# Patient Record
Sex: Male | Born: 1975 | Race: Black or African American | Hispanic: No | Marital: Married | Smoking: Current every day smoker
Health system: Southern US, Community
[De-identification: ages and names within clinical notes are randomized; demographics above are authoritative.]

## PROBLEM LIST (undated history)

## (undated) DIAGNOSIS — K219 Gastro-esophageal reflux disease without esophagitis: Secondary | ICD-10-CM

## (undated) DIAGNOSIS — I10 Essential (primary) hypertension: Secondary | ICD-10-CM

## (undated) HISTORY — PX: HAND TENDON SURGERY: SHX663

## (undated) HISTORY — DX: Essential (primary) hypertension: I10

---

## 2004-01-18 ENCOUNTER — Emergency Department (HOSPITAL_COMMUNITY): Admission: EM | Admit: 2004-01-18 | Discharge: 2004-01-18 | Payer: Self-pay | Admitting: Emergency Medicine

## 2004-01-19 ENCOUNTER — Ambulatory Visit (HOSPITAL_BASED_OUTPATIENT_CLINIC_OR_DEPARTMENT_OTHER): Admission: RE | Admit: 2004-01-19 | Discharge: 2004-01-19 | Payer: Self-pay | Admitting: Orthopedic Surgery

## 2010-02-22 ENCOUNTER — Emergency Department (HOSPITAL_COMMUNITY): Admission: EM | Admit: 2010-02-22 | Discharge: 2010-02-22 | Payer: Self-pay | Admitting: Family Medicine

## 2010-09-07 ENCOUNTER — Emergency Department (HOSPITAL_COMMUNITY): Admission: EM | Admit: 2010-09-07 | Discharge: 2010-02-09 | Payer: Self-pay | Admitting: Emergency Medicine

## 2010-12-17 ENCOUNTER — Emergency Department (HOSPITAL_COMMUNITY)
Admission: EM | Admit: 2010-12-17 | Discharge: 2010-12-17 | Disposition: A | Discharge status: Home / Self Care | Payer: Self-pay | Attending: Emergency Medicine | Admitting: Emergency Medicine

## 2011-02-16 NOTE — Op Note (Signed)
NAME:  [name], Max M                         ACCOUNT NO.:  [id number]   MEDICAL RECORD NO.:  [id number]                   PATIENT TYPE:  AMB   LOCATION:  DSC                                  FACILITY:  [facility]   PHYSICIAN:  Cindee Salt, M.D.                    DATE OF BIRTH:  [birth year]   DATE OF PROCEDURE:  01/19/2004  DATE OF DISCHARGE:                                 OPERATIVE REPORT   PREOPERATIVE DIAGNOSIS:  Laceration, extensor tendon, right middle finger.   POSTOPERATIVE DIAGNOSIS:  Laceration, extensor tendon, right middle finger.   OPERATION:  Repair of extensor tendon, right middle finger.   SURGEON:  Cindee Salt, M.D.   ASSISTANTCarolyne Fiscal.   ANESTHESIA:  Local.   HISTORY:  The patient is a 35 year old male who suffered a laceration over  the dorsal aspect of the metacarpophalangeal joint of his right middle  finger.  He was seen today in [facility] where this was closed.  He  was referred.   PROCEDURE:  The patient was brought to the operating room where a local  anesthetic was carried out without difficulty.  He was prepped using  Duraprep in the supine position.  Right arm free.  A tourniquet was placed  high and the arm was inflated 250 mmHg.  The sutures were removed.  The flap  was opened.  The laceration to the extensor tendon was found to be a large U-  shaped ulnarly based laceration.  The wound was extended proximally to be  able to see the proximal extent.  A repair was then performed with figure-of-  eight 4-0 [place] sutures along the entire extent of the laceration.  The  wound was copiously irrigated with saline prior to closure and the joint  proximally was involved.  No further lesions were identified.  The skin was  closed with interrupted 5-0 nylon suture.  Sterile compressive dressing and  splint were applied.   The patient tolerated the procedure well.  He was discharged home to return  to the [facility] in one week.   Vicodin and Keflex supplied  by the hospital.                                               Cindee Salt, M.D.    GK/MEDQ  D:  01/19/2004  T:  01/19/2004  Job:  [id number]

## 2012-02-21 ENCOUNTER — Encounter (HOSPITAL_COMMUNITY): Payer: Self-pay | Admitting: *Deleted

## 2012-02-21 ENCOUNTER — Emergency Department (HOSPITAL_COMMUNITY)
Admission: EM | Admit: 2012-02-21 | Discharge: 2012-02-21 | Disposition: A | Discharge status: Home / Self Care | Payer: Self-pay | Attending: Emergency Medicine | Admitting: Emergency Medicine

## 2012-02-21 ENCOUNTER — Emergency Department (HOSPITAL_COMMUNITY): Payer: Self-pay

## 2012-02-21 DIAGNOSIS — R109 Unspecified abdominal pain: Secondary | ICD-10-CM | POA: Insufficient documentation

## 2012-02-21 DIAGNOSIS — R599 Enlarged lymph nodes, unspecified: Secondary | ICD-10-CM | POA: Insufficient documentation

## 2012-02-21 DIAGNOSIS — R590 Localized enlarged lymph nodes: Secondary | ICD-10-CM

## 2012-02-21 DIAGNOSIS — R1030 Lower abdominal pain, unspecified: Secondary | ICD-10-CM

## 2012-02-21 LAB — DIFFERENTIAL
Basophils Absolute: 0 10*3/uL (ref 0.0–0.1)
Basophils Relative: 1 % (ref 0–1)
Monocytes Absolute: 0.2 10*3/uL (ref 0.1–1.0)
Neutro Abs: 2.6 10*3/uL (ref 1.7–7.7)

## 2012-02-21 LAB — COMPREHENSIVE METABOLIC PANEL
AST: 15 U/L (ref 0–37)
Albumin: 3.9 g/dL (ref 3.5–5.2)
Calcium: 9.6 mg/dL (ref 8.4–10.5)
Chloride: 106 mEq/L (ref 96–112)
Creatinine, Ser: 1.29 mg/dL (ref 0.50–1.35)
Total Bilirubin: 0.7 mg/dL (ref 0.3–1.2)
Total Protein: 7.3 g/dL (ref 6.0–8.3)

## 2012-02-21 LAB — CBC
HCT: 41.3 % (ref 39.0–52.0)
MCHC: 33.4 g/dL (ref 30.0–36.0)
RDW: 13.3 % (ref 11.5–15.5)

## 2012-02-21 LAB — URINE MICROSCOPIC-ADD ON

## 2012-02-21 LAB — URINALYSIS, ROUTINE W REFLEX MICROSCOPIC
Ketones, ur: NEGATIVE mg/dL
Leukocytes, UA: NEGATIVE
Nitrite: NEGATIVE
Protein, ur: NEGATIVE mg/dL

## 2012-02-21 MED ORDER — IBUPROFEN 800 MG PO TABS: 800.0000 mg | ORAL_TABLET | Freq: Three times a day (TID) | ORAL | Status: AC

## 2012-02-21 MED ORDER — IOHEXOL 300 MG/ML  SOLN
100.0000 mL | Freq: Once | INTRAMUSCULAR | Status: AC | PRN
  Administered 2012-02-21: 100 mL via INTRAVENOUS

## 2012-02-21 NOTE — ED Provider Notes (Signed)
History   This chart was scribed for Max Octave, MD by Max [name]. The patient was seen in room APA09/APA09. Patient's care was started at 1317.   CSN: [id number]  Arrival date & time 02/21/12  1317   First MD Initiated Contact with Patient 02/21/12 1508      Chief Complaint  Patient presents with  . Abdominal Pain    (Consider location/radiation/quality/duration/timing/severity/associated sxs/prior treatment) HPI  Max [name] is a 36 y.o. male who presents to the Emergency Department complaining of right lower abdominal pain onset four days ago and persistent since. Per PT says the pain is not worsening or getting better but has been constant. Denies injury, vomiting, dysuria, hematuria, fevers, pain in the testicles, abdominal surgery.      History reviewed. No pertinent past medical history.  History reviewed. No pertinent past surgical history.  History reviewed. No pertinent family history.  History  Substance Use Topics  . Smoking status: Current Everyday Smoker  . Smokeless tobacco: Not on file  . Alcohol Use: Yes      Review of Systems  All other systems reviewed and are negative.    Allergies  Review of patient's allergies indicates no known allergies.  Home Medications  No current outpatient prescriptions on file.  BP 143/84  Pulse 92  Temp(Src) 98.5 F (36.9 C) (Oral)  Resp 20  Ht 6' (1.829 m)  Wt 190 lb (86.183 kg)  BMI 25.77 kg/m2  SpO2 100%  Physical Exam  Nursing note and vitals reviewed. Constitutional: He is oriented to person, place, and time. He appears well-developed and well-nourished.  HENT:  Head: Normocephalic and atraumatic.  Eyes: Conjunctivae and EOM are normal. Pupils are equal, round, and reactive to light.  Neck: Normal range of motion. Neck supple.  Cardiovascular: Normal rate and regular rhythm.   Pulmonary/Chest: Effort normal and breath sounds normal.  Abdominal: Soft. Bowel sounds are normal. There is  no tenderness. There is no tenderness at McBurney's point.  Genitourinary:       Tenderness, right inguinal lymphadenopathy, No overlying skin change, no testicle pain, no appreciable inguinal hernia. No CVA pain.   Musculoskeletal: Normal range of motion.  Neurological: He is alert and oriented to person, place, and time.  Skin: Skin is warm and dry.  Psychiatric: He has a normal mood and affect.    ED Course  Procedures (including critical care time)  0322 Pt seen and agrees with course of care, to have UA and blood work.    Labs Reviewed  URINALYSIS, ROUTINE W REFLEX MICROSCOPIC - Abnormal; Notable for the following:    Hgb urine dipstick TRACE (*)    All other components within normal limits  COMPREHENSIVE METABOLIC PANEL - Abnormal; Notable for the following:    GFR calc non Af Amer 71 (*)    GFR calc Af Amer 82 (*)    All other components within normal limits  CBC  DIFFERENTIAL  URINE MICROSCOPIC-ADD ON  GC/CHLAMYDIA PROBE AMP, URINE   Ct Abdomen Pelvis W Contrast  02/21/2012  *RADIOLOGY REPORT*  Clinical Data: Abdominal pain.  CT ABDOMEN AND PELVIS WITH CONTRAST  Technique:  Multidetector CT imaging of the abdomen and pelvis was performed following the standard protocol during bolus administration of intravenous contrast.  Contrast: OMNIPAQUE IOHEXOL 300 MG/ML  SOLN  Comparison: None.  Findings: Lung bases are clear.  No effusions.  Heart is normal size.  Appendix is visualized and is normal. Bowel grossly unremarkable. No free fluid,  free air, or adenopathy.  The liver, gallbladder, spleen, pancreas, adrenals and kidneys are unremarkable.  No acute bony abnormality.  IMPRESSION: Unremarkable study.  Original Report Authenticated By: Cyndie Chime, M.D.     No diagnosis found.    MDM  Right inguinal pain for the past 4 days without history of trauma, nausea, vomiting, fever, urinary symptoms or testicular pain. Tender lymphadenopathy on exam without appreciable  inguinal hernia. no testicular pain. Abdomen soft and nontender. No pain at McBurney's point.  Urinalysis negative. No white blood cell count. No testicular pain. GC, chlamydia swab sent. We'll treat with anti-inflammatories and followup with PCP. No antibiotics indicated at this point.  I personally performed the services described in this documentation, which was scribed in my presence.  The recorded information has been reviewed and considered.    Max Octave, MD 02/21/12 [phone]

## 2012-02-21 NOTE — ED Notes (Signed)
Pain RLQ x 4 days, no nvd.

## 2012-02-21 NOTE — Discharge Instructions (Signed)
Lymphadenopathy Lymphadenopathy means "disease of the lymph glands." But the term is usually used to describe swollen or enlarged lymph glands, also called lymph nodes. These are the bean-shaped organs found in many locations including the neck, underarm, and groin. Lymph glands are part of the immune system, which fights infections in your body. Lymphadenopathy can occur in just one area of the body, such as the neck, or it can be generalized, with lymph node enlargement in several areas. The nodes found in the neck are the most common sites of lymphadenopathy. CAUSES  When your immune system responds to germs (such as viruses or bacteria ), infection-fighting cells and fluid build up. This causes the glands to grow in size. This is usually not something to worry about. Sometimes, the glands themselves can become infected and inflamed. This is called lymphadenitis. Enlarged lymph nodes can be caused by many diseases:  Bacterial disease, such as strep throat or a skin infection.   Viral disease, such as a common cold.   Other germs, such as lyme disease, tuberculosis, or sexually transmitted diseases.   Cancers, such as lymphoma (cancer of the lymphatic system) or leukemia (cancer of the white blood cells).   Inflammatory diseases such as lupus or rheumatoid arthritis.   Reactions to medications.  Many of the diseases above are rare, but important. This is why you should see your caregiver if you have lymphadenopathy. SYMPTOMS   Swollen, enlarged lumps in the neck, back of the head or other locations.   Tenderness.   Warmth or redness of the skin over the lymph nodes.   Fever.  DIAGNOSIS  Enlarged lymph nodes are often near the source of infection. They can help healthcare providers diagnose your illness. For instance:   Swollen lymph nodes around the jaw might be caused by an infection in the mouth.   Enlarged glands in the neck often signal a throat infection.   Lymph nodes that  are swollen in more than one area often indicate an illness caused by a virus.  Your caregiver most likely will know what is causing your lymphadenopathy after listening to your history and examining you. Blood tests, x-rays or other tests may be needed. If the cause of the enlarged lymph node cannot be found, and it does not go away by itself, then a biopsy may be needed. Your caregiver will discuss this with you. TREATMENT  Treatment for your enlarged lymph nodes will depend on the cause. Many times the nodes will shrink to normal size by themselves, with no treatment. Antibiotics or other medicines may be needed for infection. Only take over-the-counter or prescription medicines for pain, discomfort or fever as directed by your caregiver. HOME CARE INSTRUCTIONS  Swollen lymph glands usually return to normal when the underlying medical condition goes away. If they persist, contact your health-care provider. He/she might prescribe antibiotics or other treatments, depending on the diagnosis. Take any medications exactly as prescribed. Keep any follow-up appointments made to check on the condition of your enlarged nodes.  SEEK MEDICAL CARE IF:   Swelling lasts for more than two weeks.   You have symptoms such as weight loss, night sweats, fatigue or fever that does not go away.   The lymph nodes are hard, seem fixed to the skin or are growing rapidly.   Skin over the lymph nodes is red and inflamed. This could mean there is an infection.  SEEK IMMEDIATE MEDICAL CARE IF:   Fluid starts leaking from the area of the   enlarged lymph node.   You develop a fever of 102 F (38.9 C) or greater.   Severe pain develops (not necessarily at the site of a large lymph node).   You develop chest pain or shortness of breath.   You develop worsening abdominal pain.  MAKE SURE YOU:   Understand these instructions.   Will watch your condition.   Will get help right away if you are not doing well or get  worse.  Document Released: 06/26/2008 Document Revised: 09/06/2011 Document Reviewed: 06/26/2008 Exit[employer] Patient Information 2012 [employer]. 

## 2012-02-22 LAB — GC/CHLAMYDIA PROBE AMP, URINE: Chlamydia, Swab/Urine, PCR: NEGATIVE

## 2013-01-15 ENCOUNTER — Emergency Department (HOSPITAL_COMMUNITY)
Admission: EM | Admit: 2013-01-15 | Discharge: 2013-01-15 | Disposition: A | Discharge status: Home / Self Care | Payer: BC Managed Care – PPO | Attending: Emergency Medicine | Admitting: Emergency Medicine

## 2013-01-15 ENCOUNTER — Encounter (HOSPITAL_COMMUNITY): Payer: Self-pay

## 2013-01-15 DIAGNOSIS — F17200 Nicotine dependence, unspecified, uncomplicated: Secondary | ICD-10-CM | POA: Insufficient documentation

## 2013-01-15 DIAGNOSIS — R61 Generalized hyperhidrosis: Secondary | ICD-10-CM | POA: Insufficient documentation

## 2013-01-15 DIAGNOSIS — K529 Noninfective gastroenteritis and colitis, unspecified: Secondary | ICD-10-CM

## 2013-01-15 DIAGNOSIS — K5289 Other specified noninfective gastroenteritis and colitis: Secondary | ICD-10-CM | POA: Insufficient documentation

## 2013-01-15 DIAGNOSIS — R197 Diarrhea, unspecified: Secondary | ICD-10-CM | POA: Insufficient documentation

## 2013-01-15 LAB — URINALYSIS, ROUTINE W REFLEX MICROSCOPIC
Hgb urine dipstick: NEGATIVE
Nitrite: NEGATIVE
Specific Gravity, Urine: 1.025 (ref 1.005–1.030)
pH: 6 (ref 5.0–8.0)

## 2013-01-15 LAB — CBC WITH DIFFERENTIAL/PLATELET
Basophils Absolute: 0 10*3/uL (ref 0.0–0.1)
HCT: 43.8 % (ref 39.0–52.0)
Hemoglobin: 15 g/dL (ref 13.0–17.0)
Lymphocytes Relative: 31 % (ref 12–46)
Monocytes Absolute: 0.2 10*3/uL (ref 0.1–1.0)
Monocytes Relative: 4 % (ref 3–12)
Neutro Abs: 2.5 10*3/uL (ref 1.7–7.7)
Neutrophils Relative %: 61 % (ref 43–77)
WBC: 4.2 10*3/uL (ref 4.0–10.5)

## 2013-01-15 LAB — URINE MICROSCOPIC-ADD ON

## 2013-01-15 LAB — COMPREHENSIVE METABOLIC PANEL
Albumin: 4.4 g/dL (ref 3.5–5.2)
BUN: 16 mg/dL (ref 6–23)
Calcium: 9.6 mg/dL (ref 8.4–10.5)
Creatinine, Ser: 1.41 mg/dL — ABNORMAL HIGH (ref 0.50–1.35)
Total Protein: 7.8 g/dL (ref 6.0–8.3)

## 2013-01-15 LAB — LIPASE, BLOOD: Lipase: 16 U/L (ref 11–59)

## 2013-01-15 MED ORDER — ONDANSETRON HCL 4 MG/2ML IJ SOLN
4.0000 mg | Freq: Once | INTRAMUSCULAR | Status: AC
  Administered 2013-01-15: 4 mg via INTRAVENOUS
  Filled 2013-01-15: qty 2

## 2013-01-15 MED ORDER — DIPHENOXYLATE-ATROPINE 2.5-0.025 MG PO TABS
2.0000 | ORAL_TABLET | Freq: Once | ORAL | Status: AC
  Administered 2013-01-15: 2 via ORAL
  Filled 2013-01-15: qty 2

## 2013-01-15 MED ORDER — DIPHENOXYLATE-ATROPINE 2.5-0.025 MG PO TABS: 1.0000 | ORAL_TABLET | Freq: Four times a day (QID) | ORAL | Status: DC | PRN

## 2013-01-15 MED ORDER — ONDANSETRON HCL 4 MG PO TABS: 4.0000 mg | ORAL_TABLET | Freq: Three times a day (TID) | ORAL | Status: DC | PRN

## 2013-01-15 MED ORDER — SODIUM CHLORIDE 0.9 % IV BOLUS (SEPSIS)
1000.0000 mL | Freq: Once | INTRAVENOUS | Status: AC
  Administered 2013-01-15: 1000 mL via INTRAVENOUS

## 2013-01-15 NOTE — ED Notes (Addendum)
Complain of vomiting yesterday. Diarrhea and nausea today.

## 2013-01-15 NOTE — ED Notes (Signed)
Patient is resting comfortably. 

## 2013-01-16 NOTE — ED Provider Notes (Signed)
History     CSN: [id number]  Arrival date & time 01/15/13  1324   First MD Initiated Contact with Patient 01/15/13 0809      Chief Complaint  Patient presents with  . Emesis    (Consider location/radiation/quality/duration/timing/severity/associated sxs/prior treatment) HPI Comments: Max [name] is a 37 y.o. Male otherwise healthy patient who developed several episodes of vomiting late last night,  Then woke with nonbloody, loosely formed stools this morning x 2.  He continues to have nausea without emesis and last episode of diarrhea was over 2 hours ago.  He describes abdominal cramping prior to bm's,  He has not tolerated PO intake this morning.  But is otherwise abdominal pain free and has had no documented fevers,  But mother reports was diaphoretic after emesis last night.  He reports ate chicken and vegetables 2 nights ago, then ate leftover chicken for breakfast yesterday morning that had been left out on the stove.  It tasted good, no other household members ate the leftover chicken nor do they have symptoms.       The history is provided by the patient and a parent.    History reviewed. No pertinent past medical history.  History reviewed. No pertinent past surgical history.  No family history on file.  History  Substance Use Topics  . Smoking status: Current Every Day Smoker  . Smokeless tobacco: Not on file  . Alcohol Use: Yes      Review of Systems  Constitutional: Positive for diaphoresis. Negative for fever.  HENT: Negative for congestion, sore throat and neck pain.   Eyes: Negative.   Respiratory: Negative for chest tightness and shortness of breath.   Cardiovascular: Negative for chest pain.  Gastrointestinal: Positive for nausea, vomiting and diarrhea. Negative for abdominal pain and abdominal distention.  Genitourinary: Negative.   Musculoskeletal: Negative for joint swelling and arthralgias.  Skin: Negative.  Negative for rash and wound.   Neurological: Negative for dizziness, weakness, light-headedness, numbness and headaches.  Psychiatric/Behavioral: Negative.     Allergies  Review of patient's allergies indicates no known allergies.  Home Medications   Current Outpatient Rx  Name  Route  Sig  Dispense  Refill  . diphenoxylate-atropine (LOMOTIL) 2.5-0.025 MG per tablet   Oral   Take 1 tablet by mouth 4 (four) times daily as needed for diarrhea or loose stools.   15 tablet   0   . ondansetron (ZOFRAN) 4 MG tablet   Oral   Take 1 tablet (4 mg total) by mouth every 8 (eight) hours as needed for nausea.   12 tablet   0     BP 126/79  Pulse 59  Temp(Src) 98.1 F (36.7 C) (Oral)  Resp 16  Ht 6' (1.829 m)  Wt 185 lb (83.915 kg)  BMI 25.08 kg/m2  SpO2 100%  Physical Exam  Nursing note and vitals reviewed. Constitutional: He appears well-developed and well-nourished.  HENT:  Head: Normocephalic and atraumatic.  Mouth/Throat: Oropharynx is clear and moist.  Eyes: Conjunctivae are normal.  Neck: Normal range of motion.  Cardiovascular: Normal rate, regular rhythm, normal heart sounds and intact distal pulses.   Pulmonary/Chest: Effort normal and breath sounds normal. He has no wheezes.  Abdominal: Soft. Bowel sounds are normal. He exhibits no distension. There is no tenderness. There is no rebound and no guarding.  Musculoskeletal: Normal range of motion.  Neurological: He is alert.  Skin: Skin is warm and dry.  Psychiatric: He has a normal mood and  affect.    ED Course  Procedures (including critical care time)  Labs Reviewed  COMPREHENSIVE METABOLIC PANEL - Abnormal; Notable for the following:    Creatinine, Ser 1.41 (*)    GFR calc non Af Amer 63 (*)    GFR calc Af Amer 73 (*)    All other components within normal limits  URINALYSIS, ROUTINE W REFLEX MICROSCOPIC - Abnormal; Notable for the following:    Bilirubin Urine SMALL (*)    Ketones, ur TRACE (*)    Protein, ur TRACE (*)     Urobilinogen, UA 2.0 (*)    All other components within normal limits  URINE MICROSCOPIC-ADD ON - Abnormal; Notable for the following:    Squamous Epithelial / LPF FEW (*)    All other components within normal limits  LIPASE, BLOOD  CBC WITH DIFFERENTIAL   No results found.   1. Gastroenteritis    Medications  sodium chloride 0.9 % bolus 1,000 mL (0 mLs Intravenous Stopped 01/15/13 0959)  ondansetron (ZOFRAN) injection 4 mg (4 mg Intravenous Given 01/15/13 0851)  diphenoxylate-atropine (LOMOTIL) 2.5-0.025 MG per tablet 2 tablet (2 tablets Oral Given 01/15/13 1047)     Pt was given IV fluids, zofran and he has tolerated PO intake while in ed.  He had no diarrhea and no emesis while in ed MDM  Pt with probable viral gastroenteritis.  Labs reviewed, creatinine borderline elevated,  gfr also low as it was last year at previous visit.  Discussed this with patient and encouraged obtaining pcp with further testing as warranted.  Prescribed zofran prn continued nausea.  Given one dose of lomotil,  Prescription as needed for continued diarrhea.  BRAT.  Prn f/u.        Burgess Amor, PA-C 01/16/13 [phone]

## 2013-01-19 NOTE — ED Provider Notes (Signed)
Medical screening examination/treatment/procedure(s) were performed by non-physician practitioner and as supervising physician I was immediately available for consultation/collaboration.    Shelda Jakes, MD 01/19/13 1018

## 2013-01-20 ENCOUNTER — Telehealth (HOSPITAL_COMMUNITY): Payer: Self-pay | Admitting: Emergency Medicine

## 2013-01-20 NOTE — ED Notes (Signed)
Pt called to check on gc/clamydia probe and pt told that probe was negative.

## 2016-09-22 ENCOUNTER — Encounter (HOSPITAL_COMMUNITY): Payer: Self-pay | Admitting: Emergency Medicine

## 2016-09-22 ENCOUNTER — Emergency Department (HOSPITAL_COMMUNITY): Payer: Self-pay

## 2016-09-22 ENCOUNTER — Emergency Department (HOSPITAL_COMMUNITY)
Admission: EM | Admit: 2016-09-22 | Discharge: 2016-09-22 | Disposition: A | Discharge status: Home / Self Care | Payer: Self-pay | Attending: Emergency Medicine | Admitting: Emergency Medicine

## 2016-09-22 DIAGNOSIS — F17290 Nicotine dependence, other tobacco product, uncomplicated: Secondary | ICD-10-CM | POA: Insufficient documentation

## 2016-09-22 DIAGNOSIS — M546 Pain in thoracic spine: Secondary | ICD-10-CM

## 2016-09-22 DIAGNOSIS — R030 Elevated blood-pressure reading, without diagnosis of hypertension: Secondary | ICD-10-CM | POA: Insufficient documentation

## 2016-09-22 DIAGNOSIS — F1290 Cannabis use, unspecified, uncomplicated: Secondary | ICD-10-CM | POA: Insufficient documentation

## 2016-09-22 DIAGNOSIS — M62830 Muscle spasm of back: Secondary | ICD-10-CM | POA: Insufficient documentation

## 2016-09-22 DIAGNOSIS — D171 Benign lipomatous neoplasm of skin and subcutaneous tissue of trunk: Secondary | ICD-10-CM | POA: Insufficient documentation

## 2016-09-22 MED ORDER — CYCLOBENZAPRINE HCL 10 MG PO TABS: 10.0000 mg | ORAL_TABLET | Freq: Three times a day (TID) | ORAL | 0 refills | Status: DC

## 2016-09-22 MED ORDER — TRAMADOL HCL 50 MG PO TABS: 50.0000 mg | ORAL_TABLET | Freq: Four times a day (QID) | ORAL | 0 refills | Status: DC | PRN

## 2016-09-22 NOTE — ED Triage Notes (Signed)
Patient c/o mid-back pain that radiates up into shoulders and neck. Per patient pain in neck with certain movement. Denies any current injuries but states old injuries possibly aggravated by lifting and twisting and work. Patient reports pain x3-4 days in which he has taken Aleve with no relief.

## 2016-09-22 NOTE — Discharge Instructions (Signed)
As discussed your x-rays are negative with no acute findings of your thoracic spine.  Suspect you may have muscle strain and nerve irritation causing your symptoms.  Use the medications as prescribed, using caution with these as they can make you sleepy.  Apply heating pad to your back 20 minutes several times daily.  You should have your blood pressure rechecked when your symptoms are improved as your blood pressure is elevated today but could be secondary to pain.  Additionally as discussed if this lipoma is causing you discomfort you may consider surgical removal.  You are been referred to Dr. Rosana Hoes if you decide you want to proceed with this procedure.
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
## 2016-10-01 HISTORY — PX: COLONOSCOPY: SHX174

## 2016-10-16 ENCOUNTER — Ambulatory Visit (INDEPENDENT_AMBULATORY_CARE_PROVIDER_SITE_OTHER): Payer: Self-pay | Admitting: Adult Health

## 2016-10-16 ENCOUNTER — Encounter: Payer: Self-pay | Admitting: Gastroenterology

## 2016-10-16 ENCOUNTER — Encounter: Payer: Self-pay | Admitting: Adult Health

## 2016-10-16 VITALS — BP 142/82 | Temp 98.0°F | Ht 72.0 in | Wt 202.2 lb

## 2016-10-16 DIAGNOSIS — Z1211 Encounter for screening for malignant neoplasm of colon: Secondary | ICD-10-CM

## 2016-10-16 DIAGNOSIS — I10 Essential (primary) hypertension: Secondary | ICD-10-CM

## 2016-10-16 DIAGNOSIS — Z7689 Persons encountering health services in other specified circumstances: Secondary | ICD-10-CM

## 2016-10-16 NOTE — Progress Notes (Signed)
Patient presents to clinic today to establish care. 41 year old male who  has a past medical history of Essential hypertension.  Acute Concerns: Establish Care   Chronic Issues: Essential Hypertension  - He was seen in the ER on 09/22/2016 and had elevation in diastolic blood pressure readings. He has not been checking his blood pressure at home.   BP Readings from Last 3 Encounters:  10/16/16 (!) 142/82  09/22/16 (!) 128/101  01/15/13 126/79   He denies any dizziness or lightheadedness.   Family history of colon cancer - Reports that his paternal grandfather and multiple aunts and uncles on his father side have been diagnosed with colon cancer    Health Maintenance: Dental -- Does not do routine care  Vision -- Does not do routine care  Immunizations -- UTD - does not want flu  Colonoscopy -- Never had  Diet: He has fast food atleast once per day  Exercise: walks at home but not on a regular basis    Past Medical History:  Diagnosis Date  . Essential hypertension     Past Surgical History:  Procedure Laterality Date  . HAND TENDON SURGERY Right     Current Outpatient Prescriptions on File Prior to Visit  Medication Sig Dispense Refill  . cyclobenzaprine (FLEXERIL) 10 MG tablet Take 1 tablet (10 mg total) by mouth 3 (three) times daily. 30 tablet 0  . traMADol (ULTRAM) 50 MG tablet Take 1 tablet (50 mg total) by mouth every 6 (six) hours as needed. 20 tablet 0   No current facility-administered medications on file prior to visit.     No Known Allergies  Family History  Problem Relation Age of Onset  . Cancer Mother   . Cancer Father   . Diabetes Sister     Social History   Social History  . Marital status: Single    Spouse name: N/A  . Number of children: N/A  . Years of education: N/A   Occupational History  . Not on file.   Social History Main Topics  . Smoking status: Current Every Day Smoker    Packs/day: 0.03    Years: 22.00    Types:  Cigars  . Smokeless tobacco: Never Used  . Alcohol use Yes     Comment: occasional  . Drug use:     Types: Marijuana  . Sexual activity: Not on file   Other Topics Concern  . Not on file   Social History Narrative   Works as a Educational psychologist    Not married    Two children - do not live with this     Review of Systems  Constitutional: Negative.   HENT: Negative.   Eyes: Negative.   Respiratory: Negative.   Cardiovascular: Negative.   Genitourinary: Negative.   Skin: Negative.   Neurological: Negative.   Psychiatric/Behavioral: Negative.   All other systems reviewed and are negative.   BP (!) 142/82   Temp 98 F (36.7 C) (Oral)   Ht 6' (1.829 m)   Wt 202 lb 3.2 oz (91.7 kg)   BMI 27.42 kg/m   Physical Exam  Constitutional: He is oriented to person, place, and time and well-developed, well-nourished, and in no distress. No distress.  Neck: Normal range of motion. Neck supple.  Cardiovascular: Normal rate, regular rhythm, normal heart sounds and intact distal pulses.  Exam reveals no gallop and no friction rub.   No murmur heard. Pulmonary/Chest: Effort normal and breath sounds normal.  No respiratory distress. He has no wheezes. He has no rales. He exhibits no tenderness.  Abdominal: Soft. Bowel sounds are normal. He exhibits no distension and no mass. There is no tenderness. There is no rebound and no guarding.  Lymphadenopathy:    He has no cervical adenopathy.  Neurological: He is alert and oriented to person, place, and time. Gait normal. GCS score is 15.  Skin: Skin is warm and dry. No rash noted. He is not diaphoretic. No erythema. No pallor.  Psychiatric: Mood, memory, affect and judgment normal.  Nursing note and vitals reviewed.   Assessment/Plan: 1. Encounter to establish care - Follow up for CPE  - Follow up sooner if needed - I would like him to cut out fast food and start exercising   2. Colon cancer screening - Ambulatory referral to  Gastroenterology  3. Essential hypertension - I will give him 1-2 months to change diet and exercising.  - If no improvement in blood pressure then will start him on lisinopril  - Start monitoring at home  Dorothyann Peng, NP

## 2016-11-23 ENCOUNTER — Ambulatory Visit (INDEPENDENT_AMBULATORY_CARE_PROVIDER_SITE_OTHER): Payer: Self-pay | Admitting: Gastroenterology

## 2016-11-23 ENCOUNTER — Encounter (INDEPENDENT_AMBULATORY_CARE_PROVIDER_SITE_OTHER): Payer: Self-pay

## 2016-11-23 ENCOUNTER — Encounter: Payer: Self-pay | Admitting: Gastroenterology

## 2016-11-23 VITALS — BP 118/90 | HR 72 | Ht 72.0 in | Wt 195.0 lb

## 2016-11-23 DIAGNOSIS — Z800 Family history of malignant neoplasm of digestive organs: Secondary | ICD-10-CM

## 2016-11-23 DIAGNOSIS — K625 Hemorrhage of anus and rectum: Secondary | ICD-10-CM

## 2016-11-23 DIAGNOSIS — I10 Essential (primary) hypertension: Secondary | ICD-10-CM | POA: Insufficient documentation

## 2016-11-23 MED ORDER — NA SULFATE-K SULFATE-MG SULF 17.5-3.13-1.6 GM/177ML PO SOLN: 1.0000 | Freq: Once | ORAL | 0 refills | Status: AC

## 2016-11-23 NOTE — Progress Notes (Signed)
HPI :  41 y/o male with a history of untreated hypertension here for a new patient visit to discuss colon cancer screening.   Father side of the family with strong history of colon cancer - grandfather - thinks passed at around 68, 1 aunt - dx age 30, 40 uncles  - all with colon cancer, ranging age at diagnosis from 41 to 89s,  2 uncles with pancreatic cancer. Father had leukemia.   He has had some blood in the stools in the past periodically. Streaks of red blood on the tissue paper. No constipation or diarrhea. No weight loss. Eating well. No abdominal pains.   He has had history of reflux and heartburn. He is taking zantac PRN which works well. He has not tolerated prilosec in the past.   He has a history of HTN but states he does not take anything for it currently. Due to follow up with primary care in the near future to consider therapy per his report.  Past Medical History:  Diagnosis Date  . Essential hypertension      Past Surgical History:  Procedure Laterality Date  . HAND TENDON SURGERY Right    Family History  Problem Relation Age of Onset  . Breast cancer Mother   . Leukemia Father   . Diabetes Sister   . Colon cancer Paternal Grandfather   . Diabetes Brother     from gun  shot  . Heart disease Maternal Grandmother   . Heart attack Maternal Grandmother   . Colon cancer Paternal Aunt   . Colon cancer Paternal Uncle     x 4  . Prostate cancer Paternal Uncle     x 2  . Lupus Maternal Uncle     x 2  . Kidney failure Maternal Uncle    Social History  Substance Use Topics  . Smoking status: Current Every Day Smoker    Packs/day: 0.03    Years: 22.00    Types: Cigars  . Smokeless tobacco: Never Used  . Alcohol use Yes     Comment: occasional   No current outpatient prescriptions on file.   No current facility-administered medications for this visit.    No Known Allergies   Review of Systems: All systems reviewed and negative except where noted in  HPI.   Lab Results  Component Value Date   WBC 4.2 01/15/2013   HGB 15.0 01/15/2013   HCT 43.8 01/15/2013   MCV 85.4 01/15/2013   PLT 178 01/15/2013     Physical Exam: BP 118/90 (BP Location: Left Arm, Patient Position: Sitting, Cuff Size: Normal)   Pulse 72   Ht 6' (1.829 m) Comment: height measured without shoes  Wt 195 lb (88.5 kg)   BMI 26.45 kg/m  Constitutional: Pleasant,well-developed, male in no acute distress. HEENT: Normocephalic and atraumatic. Conjunctivae are normal. No scleral icterus. Neck supple.  Cardiovascular: Normal rate, regular rhythm.  Pulmonary/chest: Effort normal and breath sounds normal. No wheezing, rales or rhonchi. Abdominal: Soft, nondistended, nontender. There are no masses palpable. No hepatomegaly. Extremities: no edema Lymphadenopathy: No cervical adenopathy noted. Neurological: Alert and oriented to person place and time. Skin: Skin is warm and dry. No rashes noted. Psychiatric: Normal mood and affect. Behavior is normal.   ASSESSMENT AND PLAN: 41 year old male with history of periodic scant rectal bleeding here and strong family history of colon cancer on father side of family as outlined above, he has 6 second degree relatives with colon cancer, some diagnosed at age  46s. Given this family history and periodic bleeding I offered him a colonoscopy. We discussed risks and benefits of colonoscopy and anesthesia and following this discussion he wished to proceed. Further recommendations pending the results. If he has any high-risk lesions we may refer him for genetic counseling.   Cushing Cellar, MD [facility] Pager [phone]  CC: Dorothyann Peng, NP

## 2016-11-23 NOTE — Patient Instructions (Signed)
If you are age 41 or older, your body mass index should be between 23-30. Your Body mass index is 26.45 kg/m. If this is out of the aforementioned range listed, please consider follow up with your Primary Care Provider.  If you are age 50 or younger, your body mass index should be between 19-25. Your Body mass index is 26.45 kg/m. If this is out of the aformentioned range listed, please consider follow up with your Primary Care Provider.   We have sent the following medications to your pharmacy for you to pick up at your convenience:  Cool have been scheduled for a colonoscopy. Please follow written instructions given to you at your visit today.  Please pick up your prep supplies at the pharmacy within the next 1-3 days. If you use inhalers (even only as needed), please bring them with you on the day of your procedure. Your physician has requested that you go to www.startemmi.com and enter the access code given to you at your visit today. This web site gives a general overview about your procedure. However, you should still follow specific instructions given to you by our office regarding your preparation for the procedure.  Thank you.

## 2016-12-14 ENCOUNTER — Encounter: Payer: Self-pay | Admitting: Adult Health

## 2016-12-14 NOTE — Progress Notes (Deleted)
Subjective:    Patient ID: Max Moore Emert Jr., male    DOB: 1976/07/20, 41 y.o.   MRN: [id number]  HPI  Patient presents for yearly preventative medicine examination. He is a pleasant 41 year old male who  has a past medical history of Essential hypertension.  All immunizations and health maintenance protocols were reviewed with the patient and needed orders were placed.  Appropriate screening laboratory values were ordered for the patient including screening of hyperlipidemia, renal function and hepatic function. If indicated by BPH, a PSA was ordered.  Medication reconciliation,  past medical history, social history, problem list and allergies were reviewed in detail with the patient  Goals were established with regard to weight loss, exercise, and  diet in compliance with medications  Review of Systems  Constitutional: Negative.   HENT: Negative.   Eyes: Negative.   Respiratory: Negative.   Cardiovascular: Negative.   Gastrointestinal: Negative.   Endocrine: Negative.   Genitourinary: Negative.   Musculoskeletal: Negative.   Skin: Negative.   Allergic/Immunologic: Negative.   Neurological: Negative.   Hematological: Negative.   Psychiatric/Behavioral: Negative.   All other systems reviewed and are negative.  Past Medical History:  Diagnosis Date  . Essential hypertension     Social History   Social History  . Marital status: Single    Spouse name: N/A  . Number of children: 2  . Years of education: N/A   Occupational History  . Not on file.   Social History Main Topics  . Smoking status: Current Every Day Smoker    Packs/day: 0.03    Years: 22.00    Types: Cigars  . Smokeless tobacco: Never Used  . Alcohol use Yes     Comment: occasional  . Drug use: Yes    Types: Marijuana  . Sexual activity: Not on file   Other Topics Concern  . Not on file   Social History Narrative   Works as a Educational psychologist    Not married    Two children - do not  live with this     Past Surgical History:  Procedure Laterality Date  . HAND TENDON SURGERY Right     Family History  Problem Relation Age of Onset  . Breast cancer Mother   . Leukemia Father   . Diabetes Sister   . Colon cancer Paternal Grandfather   . Diabetes Brother     from gun  shot  . Heart disease Maternal Grandmother   . Heart attack Maternal Grandmother   . Colon cancer Paternal Aunt   . Colon cancer Paternal Uncle     x 4  . Prostate cancer Paternal Uncle     x 2  . Lupus Maternal Uncle     x 2  . Kidney failure Maternal Uncle     No Known Allergies  No current outpatient prescriptions on file prior to visit.   No current facility-administered medications on file prior to visit.     There were no vitals taken for this visit.      Objective:   Physical Exam  Constitutional: He is oriented to person, place, and time. He appears well-developed and well-nourished. No distress.  HENT:  Head: Normocephalic and atraumatic.  Right Ear: External ear normal.  Left Ear: External ear normal.  Nose: Nose normal.  Mouth/Throat: Oropharynx is clear and moist. No oropharyngeal exudate.  Eyes: Conjunctivae and EOM are normal. Pupils are equal, round, and reactive to light. Right eye exhibits no  discharge. Left eye exhibits no discharge. No scleral icterus.  Neck: Normal range of motion. Neck supple. No JVD present. No tracheal deviation present. No thyromegaly present.  Cardiovascular: Normal rate, regular rhythm, normal heart sounds and intact distal pulses.  Exam reveals no gallop and no friction rub.   No murmur heard. Pulmonary/Chest: Effort normal and breath sounds normal. No stridor. No respiratory distress. He has no wheezes. He has no rales. He exhibits no tenderness.  Abdominal: Soft. Bowel sounds are normal. He exhibits no distension and no mass. There is no tenderness. There is no rebound and no guarding.  Musculoskeletal: Normal range of motion. He exhibits  no edema, tenderness or deformity.  Lymphadenopathy:    He has no cervical adenopathy.  Neurological: He is alert and oriented to person, place, and time. He has normal reflexes. He displays normal reflexes. No cranial nerve deficit. He exhibits normal muscle tone. Coordination normal.  Skin: Skin is warm and dry. No rash noted. He is not diaphoretic. No erythema. No pallor.  Psychiatric: He has a normal mood and affect. His behavior is normal. Judgment and thought content normal.  Nursing note and vitals reviewed.     Assessment & Plan:

## 2017-01-03 ENCOUNTER — Encounter: Payer: Self-pay | Admitting: Internal Medicine

## 2017-01-03 ENCOUNTER — Encounter: Payer: Self-pay | Admitting: Gastroenterology

## 2017-01-03 ENCOUNTER — Ambulatory Visit (AMBULATORY_SURGERY_CENTER): Payer: Self-pay | Admitting: Gastroenterology

## 2017-01-03 VITALS — BP 146/78 | HR 69 | Temp 98.9°F | Resp 13 | Ht 72.0 in | Wt 195.0 lb

## 2017-01-03 DIAGNOSIS — D128 Benign neoplasm of rectum: Secondary | ICD-10-CM

## 2017-01-03 DIAGNOSIS — Z800 Family history of malignant neoplasm of digestive organs: Secondary | ICD-10-CM

## 2017-01-03 DIAGNOSIS — K621 Rectal polyp: Secondary | ICD-10-CM

## 2017-01-03 DIAGNOSIS — K635 Polyp of colon: Secondary | ICD-10-CM

## 2017-01-03 DIAGNOSIS — D122 Benign neoplasm of ascending colon: Secondary | ICD-10-CM

## 2017-01-03 DIAGNOSIS — K921 Melena: Secondary | ICD-10-CM

## 2017-01-03 DIAGNOSIS — D129 Benign neoplasm of anus and anal canal: Secondary | ICD-10-CM

## 2017-01-03 MED ORDER — SODIUM CHLORIDE 0.9 % IV SOLN: 500.0000 mL | INTRAVENOUS | Status: DC

## 2017-01-03 NOTE — Patient Instructions (Signed)
YOU HAD AN ENDOSCOPIC PROCEDURE TODAY AT [facility]:   Refer to the procedure report that was given to you for any specific questions about what was found during the examination.  If the procedure report does not answer your questions, please call your gastroenterologist to clarify.  If you requested that your care partner not be given the details of your procedure findings, then the procedure report has been included in a sealed envelope for you to review at your convenience later.  YOU SHOULD EXPECT: Some feelings of bloating in the abdomen. Passage of more gas than usual.  Walking can help get rid of the air that was put into your GI tract during the procedure and reduce the bloating. If you had a lower endoscopy (such as a colonoscopy or flexible sigmoidoscopy) you may notice spotting of blood in your stool or on the toilet paper. If you underwent a bowel prep for your procedure, you may not have a normal bowel movement for a few days.  Please Note:  You might notice some irritation and congestion in your nose or some drainage.  This is from the oxygen used during your procedure.  There is no need for concern and it should clear up in a day or so.  SYMPTOMS TO REPORT IMMEDIATELY:   Following lower endoscopy (colonoscopy or flexible sigmoidoscopy):  Excessive amounts of blood in the stool  Significant tenderness or worsening of abdominal pains  Swelling of the abdomen that is new, acute  Fever of 100F or higher   For urgent or emergent issues, a gastroenterologist can be reached at any hour by calling [phone].   DIET:  We do recommend a small meal at first, but then you may proceed to your regular diet.  Drink plenty of fluids but you should avoid alcoholic beverages for 24 hours.  ACTIVITY:  You should plan to take it easy for the rest of today and you should NOT DRIVE or use heavy machinery until tomorrow (because of the sedation medicines used during the test).     FOLLOW UP: Our staff will call the number listed on your records the next business day following your procedure to check on you and address any questions or concerns that you may have regarding the information given to you following your procedure. If we do not reach you, we will leave a message.  However, if you are feeling well and you are not experiencing any problems, there is no need to return our call.  We will assume that you have returned to your regular daily activities without incident.  If any biopsies were taken you will be contacted by phone or by letter within the next 1-3 weeks.  Please call us at [phone] if you have not heard about the biopsies in 3 weeks.    SIGNATURES/CONFIDENTIALITY: You and/or your care partner have signed paperwork which will be entered into your electronic medical record.  These signatures attest to the fact that that the information above on your After Visit Summary has been reviewed and is understood.  Full responsibility of the confidentiality of this discharge information lies with you and/or your care-partner.  No ibuprofen,naproxen,or non-steroidal anti-inflammatory drugs for 2 weeks,resume remainder of medication,Daily fiber supplement.Information given on polyps and hemorrhoids.

## 2017-01-03 NOTE — Progress Notes (Signed)
Called to room to assist during endoscopic procedure.  Patient ID and intended procedure confirmed with present staff. Received instructions for my participation in the procedure from the performing physician.  

## 2017-01-03 NOTE — Op Note (Addendum)
[facility] Patient Name: Max [name] Procedure Date: 01/03/2017 3:11 PM MRN: [id number] Endoscopist: Remo Lipps P. Henrry Feil MD, MD Age: 40 Referring MD:  Date of Birth: 09-Mar-1976 Gender: Male Account #: [id number] Procedure:                Colonoscopy Indications:              Screening patient at increased risk: Family history                            of colorectal cancer in multiple 2nd degree                            relatives, rare rectal bleeding Medicines:                Monitored Anesthesia Care Procedure:                Pre-Anesthesia Assessment:                           - Prior to the procedure, a History and Physical                            was performed, and patient medications and                            allergies were reviewed. The patient's tolerance of                            previous anesthesia was also reviewed. The risks                            and benefits of the procedure and the sedation                            options and risks were discussed with the patient.                            All questions were answered, and informed consent                            was obtained. Prior Anticoagulants: The patient has                            taken no previous anticoagulant or antiplatelet                            agents. ASA Grade Assessment: I - A normal, healthy                            patient. After reviewing the risks and benefits,                            the patient was deemed in satisfactory condition to  undergo the procedure.                           After obtaining informed consent, the colonoscope                            was passed under direct vision. Throughout the                            procedure, the patient's blood pressure, pulse, and                            oxygen saturations were monitored continuously. The                            Model CF-HQ190L [id number]) scope was  introduced                            through the anus and advanced to the the terminal                            ileum, with identification of the appendiceal                            orifice and IC valve. The colonoscopy was performed                            without difficulty. The patient tolerated the                            procedure. The quality of the bowel preparation was                            good. The terminal ileum, ileocecal valve,                            appendiceal orifice, and rectum were photographed. Scope In: 3:21:46 PM Scope Out: 3:45:14 PM Scope Withdrawal Time: 0 hours 21 minutes 28 seconds  Total Procedure Duration: 0 hours 23 minutes 28 seconds  Findings:                 The perianal and digital rectal examinations were                            normal.                           The terminal ileum appeared normal.                           A 5 mm polyp was found in the ascending colon. The                            polyp was flat. The polyp was removed with a cold  snare. Resection and retrieval were complete.                           Two sessile polyps were found in the rectum. The                            polyps were 5 mm in size. These polyps were removed                            with a cold snare. Resection and retrieval were                            complete.                           Internal hemorrhoids were found during retroflexion.                           The exam was otherwise without abnormality. Complications:            No immediate complications. Estimated blood loss:                            Minimal. Patient had coughing during the procedure                            with possible larygospasm managed per anesthesia,                            which prolonged this procedure. Estimated Blood Loss:     Estimated blood loss was minimal. Impression:               - The examined portion of the ileum  was normal.                           - One 5 mm polyp in the ascending colon, removed                            with a cold snare. Resected and retrieved.                           - Two 5 mm polyps in the rectum, removed with a                            cold snare. Resected and retrieved.                           - Internal hemorrhoids.                           - The examination was otherwise normal. Recommendation:           - Patient has a contact number available for  emergencies. The signs and symptoms of potential                            delayed complications were discussed with the                            patient. Return to normal activities tomorrow.                            Written discharge instructions were provided to the                            patient.                           - Resume previous diet.                           - Continue present medications.                           - Daily fiber supplement for hemorrhoids if causing                            symptoms                           - Await pathology results.                           - Repeat colonoscopy is recommended for                            surveillance. The colonoscopy date will be                            determined after pathology results from today's                            exam become available for review.                           - No ibuprofen, naproxen, or other non-steroidal                            anti-inflammatory drugs for 2 weeks after polyp                            removal. Remo Lipps P. Aleecia Tapia MD, MD 01/03/2017 3:50:24 PM This report has been signed electronically.

## 2017-01-03 NOTE — Progress Notes (Signed)
Pt experienced laryngeal spasm with Sat down to mid 80s, Jaw thrust performed with sat greater equal 90% on room air.  Pt allowed to wake up in room. vss

## 2017-01-04 ENCOUNTER — Telehealth: Payer: Self-pay | Admitting: Gastroenterology

## 2017-01-04 ENCOUNTER — Telehealth: Payer: Self-pay | Admitting: Internal Medicine

## 2017-01-04 NOTE — Telephone Encounter (Signed)
  Follow up Call-  Call back number 01/03/2017  Post procedure Call Back phone  # [phone]  Permission to leave phone message Yes  Some recent data might be hidden     The number listed above [phone] is not a valid number and the home phone [phone] does not have a mailbox that has been set up yet.

## 2017-01-04 NOTE — Telephone Encounter (Signed)
Spoke to patient this morning, he reports that his temperature is back down to 97.5. No coughing, is able to drink water. He has not had anything to eat yet. He does report feeling very sore all over his body, like "someone beat him up". Advised patient to rest today, he needs to eat something and to continue to drink fluids. He was instructed to call the office if his symptoms worsen.

## 2017-01-04 NOTE — Telephone Encounter (Signed)
Thanks Carl °

## 2017-01-04 NOTE — Telephone Encounter (Signed)
Okay thanks, he should feel better over the course of today. He can call back with any recurrent symptoms

## 2017-01-04 NOTE — Telephone Encounter (Signed)
Max [name] this patient called in to Dr. Carlean Purl last night. He had a colonoscopy yesterday and developed a lot of coughing during the procedure, anesthesia thought he developed laryngospasm. He developed a fever overnight. Can you check on him to see how he is doing today? If his fever resolved and feeling okay we can continue to observe today, hopefully this resolves on its own. If he continues to have fever please let me know, may need CXR / antibiotics, possible aspiration pneumonitis. Thanks

## 2017-01-04 NOTE — Telephone Encounter (Signed)
T 101.2 with slight cough and sore throat. No resp difficulty. Mild abdominal soreness no severe pain I told him we would contact in AM to see if further eval or Tx needed and to call back sooner prn

## 2017-01-10 ENCOUNTER — Encounter: Payer: Self-pay | Admitting: Gastroenterology

## 2018-07-12 ENCOUNTER — Encounter (HOSPITAL_COMMUNITY): Payer: Self-pay | Admitting: Emergency Medicine

## 2018-07-12 ENCOUNTER — Emergency Department (HOSPITAL_COMMUNITY)
Admission: EM | Admit: 2018-07-12 | Discharge: 2018-07-12 | Disposition: A | Discharge status: Home / Self Care | Payer: Self-pay | Attending: Emergency Medicine | Admitting: Emergency Medicine

## 2018-07-12 ENCOUNTER — Other Ambulatory Visit: Payer: Self-pay

## 2018-07-12 DIAGNOSIS — M545 Low back pain, unspecified: Secondary | ICD-10-CM

## 2018-07-12 DIAGNOSIS — F17290 Nicotine dependence, other tobacco product, uncomplicated: Secondary | ICD-10-CM | POA: Insufficient documentation

## 2018-07-12 DIAGNOSIS — I10 Essential (primary) hypertension: Secondary | ICD-10-CM | POA: Insufficient documentation

## 2018-07-12 MED ORDER — METHOCARBAMOL 500 MG PO TABS: 500.0000 mg | ORAL_TABLET | Freq: Two times a day (BID) | ORAL | 0 refills | Status: DC

## 2018-07-12 MED ORDER — METHOCARBAMOL 500 MG PO TABS
750.0000 mg | ORAL_TABLET | Freq: Once | ORAL | Status: AC
  Administered 2018-07-12: 750 mg via ORAL
  Filled 2018-07-12: qty 2

## 2018-07-12 MED ORDER — KETOROLAC TROMETHAMINE 15 MG/ML IJ SOLN
15.0000 mg | Freq: Once | INTRAMUSCULAR | Status: AC
  Administered 2018-07-12: 15 mg via INTRAMUSCULAR
  Filled 2018-07-12: qty 1

## 2018-07-12 MED ORDER — NAPROXEN 500 MG PO TABS: 500.0000 mg | ORAL_TABLET | Freq: Two times a day (BID) | ORAL | 0 refills | Status: DC

## 2018-07-12 NOTE — ED Triage Notes (Signed)
Pt. Stated, I started having back pain that goes to your butt for 3 days.

## 2018-07-12 NOTE — ED Notes (Signed)
Called no answer

## 2018-07-12 NOTE — Discharge Instructions (Signed)
Take naproxen 2 times a day with meals.  Do not take other anti-inflammatories at the same time (Advil, Motrin, ibuprofen, Aleve). You may supplement with Tylenol if you need further pain control. Use Robaxin as needed for muscle stiffness or soreness. Have caution, as this may make you tired or groggy. Do not drive or operate heavy machinery while taking this medication.  Use muscle creams (bengay, icy hot, salonpas) as needed for pain.  Follow up with orthopedics if your pain is not improving. Return to the ER if you develop high fevers, numbness, loss of bowel or bladder control, or any new or concerning symptoms.

## 2018-07-12 NOTE — ED Provider Notes (Signed)
[facility] EMERGENCY DEPARTMENT Provider Note   CSN: [id number] Arrival date & time: 07/12/18  1012     History   Chief Complaint Chief Complaint  Patient presents with  . Back Pain    HPI Max [name]. is a 42 y.o. male ending for evaluation of back pain.  Patient states 3 days ago he turned when he went to take a step forward, had acute onset low back pain.  Pain is worse with movement, especially bending.  Pain remains in the same spot, bilaterally over his low back.  He denies fall, trauma, or injury.  He has not taken anything for pain including Tylenol or ibuprofen.  Nothing makes the pain better.  There is no radiation of the pain.  He denies history of back pain.  He denies fevers, chills, rash, loss of bowel or bladder control, numbness or tingling, history of cancer, or history of IV drug use.  He denies urinary symptoms.  Denies recent change in activity, or increase in lifting/physical activity.  HPI  Past Medical History:  Diagnosis Date  . Essential hypertension     Patient Active Problem List   Diagnosis Date Noted  . Essential hypertension 11/23/2016    Past Surgical History:  Procedure Laterality Date  . HAND TENDON SURGERY Right         Home Medications    Prior to Admission medications   Medication Sig Start Date End Date Taking? Authorizing Provider  methocarbamol (ROBAXIN) 500 MG tablet Take 1 tablet (500 mg total) by mouth 2 (two) times daily. 07/12/18   Jacob Chamblee, PA-C  naproxen (NAPROSYN) 500 MG tablet Take 1 tablet (500 mg total) by mouth 2 (two) times daily with a meal. 07/12/18   Jerrold Haskell, PA-C    Family History Family History  Problem Relation Age of Onset  . Breast cancer Mother   . Leukemia Father   . Diabetes Sister   . Colon cancer Paternal Grandfather   . Diabetes Brother        from gun  shot  . Heart disease Maternal Grandmother   . Heart attack Maternal Grandmother   . Colon  cancer Paternal Aunt   . Colon cancer Paternal Uncle        x 4  . Prostate cancer Paternal Uncle        x 2  . Lupus Maternal Uncle        x 2  . Kidney failure Maternal Uncle     Social History Social History   Tobacco Use  . Smoking status: Current Every Day Smoker    Packs/day: 0.03    Years: 22.00    Pack years: 0.66    Types: Cigars  . Smokeless tobacco: Never Used  Substance Use Topics  . Alcohol use: Yes    Comment: occasional  . Drug use: Yes    Types: Marijuana     Allergies   Patient has no known allergies.   Review of Systems Review of Systems  Musculoskeletal: Positive for back pain.  Neurological: Negative for numbness.  Hematological: Does not bruise/bleed easily.     Physical Exam Updated Vital Signs BP (!) 139/96 (BP Location: Right Arm)   Pulse 74   Temp 98.8 F (37.1 C) (Oral)   Resp 16   Ht 6' (1.829 m)   Wt 93 kg   SpO2 98%   BMI 27.80 kg/m   Physical Exam  Constitutional: He is oriented to person, place,  and time. He appears well-developed and well-nourished. No distress.  Standing in the room, appears uncomfortable due to pain but in no acute distress  HENT:  Head: Normocephalic and atraumatic.  Eyes: Pupils are equal, round, and reactive to light. Conjunctivae and EOM are normal.  Neck: Normal range of motion. Neck supple.  Cardiovascular: Normal rate, regular rhythm and intact distal pulses.  Pulmonary/Chest: Effort normal and breath sounds normal. No respiratory distress. He has no wheezes.  Abdominal: Soft. He exhibits no distension. There is no tenderness.  Musculoskeletal: Normal range of motion. He exhibits tenderness. He exhibits no edema or deformity.  Tennis palpation of bilateral lower back musculature.  No tenderness palpation of upper back.  Patient is ambulatory.  No pain of the hips.  No saddle paresthesias.  Strength intact bilaterally.  Neurological: He is alert and oriented to person, place, and time. No sensory  deficit.  Skin: Skin is warm and dry. Capillary refill takes less than 2 seconds.  Psychiatric: He has a normal mood and affect.  Nursing note and vitals reviewed.    ED Treatments / Results  Labs (all labs ordered are listed, but only abnormal results are displayed) Labs Reviewed - No data to display  EKG None  Radiology No results found.  Procedures Procedures (including critical care time)  Medications Ordered in ED Medications  methocarbamol (ROBAXIN) tablet 750 mg (750 mg Oral Given 07/12/18 1105)  ketorolac (TORADOL) 15 MG/ML injection 15 mg (15 mg Intramuscular Given 07/12/18 1102)     Initial Impression / Assessment and Plan / ED Course  I have reviewed the triage vital signs and the nursing notes.  Pertinent labs & imaging results that were available during my care of the patient were reviewed by me and considered in my medical decision making (see chart for details).     Patient presenting for evaluation of low back pain.  Physical exam reassuring, neurovascularly intact.  No red flags for back pain.  Pain is reproducible with palpation of the musculature.  Likely musculoskeletal.  Doubt fracture, I do not believe x-rays will be beneficial.  Doubt vertebral injury, infection, spinal cord compression, myelopathy, or cauda equina syndrome.  Will treat symptomatically with NSAIDs, muscle relaxers, muscle creams.  Patient given information to follow-up with orthopedics.  At this time, patient appears safe for discharge.  Return precautions given.  Patient states he understands agrees plan.   Final Clinical Impressions(s) / ED Diagnoses   Final diagnoses:  Acute bilateral low back pain without sciatica    ED Discharge Orders         Ordered    naproxen (NAPROSYN) 500 MG tablet  2 times daily with meals     07/12/18 1102    methocarbamol (ROBAXIN) 500 MG tablet  2 times daily     07/12/18 [place], Jamarquis Crull, PA-C 07/12/18 1720    Daleen Bo, MD 07/13/18 2145

## 2021-02-24 ENCOUNTER — Other Ambulatory Visit: Payer: Self-pay

## 2021-02-24 ENCOUNTER — Ambulatory Visit: Payer: Managed Care, Other (non HMO) | Admitting: Internal Medicine

## 2021-02-24 ENCOUNTER — Encounter: Payer: Self-pay | Admitting: Internal Medicine

## 2021-02-24 VITALS — BP 122/68 | HR 86 | Temp 98.2°F | Ht 72.0 in | Wt 192.0 lb

## 2021-02-24 DIAGNOSIS — Z0000 Encounter for general adult medical examination without abnormal findings: Secondary | ICD-10-CM | POA: Diagnosis not present

## 2021-02-24 DIAGNOSIS — Z1159 Encounter for screening for other viral diseases: Secondary | ICD-10-CM

## 2021-02-24 DIAGNOSIS — F17200 Nicotine dependence, unspecified, uncomplicated: Secondary | ICD-10-CM | POA: Diagnosis not present

## 2021-02-24 DIAGNOSIS — F39 Unspecified mood [affective] disorder: Secondary | ICD-10-CM | POA: Diagnosis not present

## 2021-02-24 DIAGNOSIS — F1010 Alcohol abuse, uncomplicated: Secondary | ICD-10-CM

## 2021-02-24 DIAGNOSIS — K219 Gastro-esophageal reflux disease without esophagitis: Secondary | ICD-10-CM | POA: Diagnosis not present

## 2021-02-24 DIAGNOSIS — Z125 Encounter for screening for malignant neoplasm of prostate: Secondary | ICD-10-CM

## 2021-02-24 NOTE — Patient Instructions (Signed)
Suicidal Feelings: How to Help Yourself Suicide is when you end your own life. There are many things you can do to help yourself feel better when struggling with these feelings. Many services and people are available to support you and others who struggle with similar feelings.  If you ever feel like you may hurt yourself or others, or have thoughts about taking your own life, get help right away. To get help:  Call your local emergency services (911 in the [place]  [facility] [place] [facility]'s health and human services helpline (211 in the [place]  Go to your nearest emergency department.  Call a suicide hotline to speak with a trained counselor. The following suicide hotlines are available in the [place]: ? 1-800-273-TALK (631)153-4977). ? 1-800-SUICIDE (223)888-5426). ? 418-868-4672. This is a hotline for Spanish speakers. ? 518-149-1156. This is a hotline for TTY users. ? 1-866-4-U-TREVOR (954)019-9072). This is a hotline for lesbian, gay, bisexual, transgender, or questioning youth. ? For a list of hotlines in [place], visit ParkingAffiliatePrograms.se.html  Contact a crisis center or a local suicide prevention center. To find a crisis center or suicide prevention center: ? Call your local hospital, clinic, community service organization, mental health center, social service provider, or health department. Ask for help with connecting to a crisis center. ? For a list of crisis centers in the [place], visit: suicidepreventionlifeline.org ? For a list of crisis centers in [place], visit: suicideprevention.ca How to help yourself feel better  Promise yourself that you will not do anything extreme when you have suicidal feelings. Remember the times you have felt hopeful. Many people have gotten through suicidal thoughts and feelings, and you can too. If you have had these feelings before, remind yourself that you can get through them again.  Let  family, friends, teachers, or counselors know how you are feeling. Try not to separate yourself from those who care about you and want to help you. Talk with someone every day, even if you do not feel sociable. Face-to-face conversation is best to help them understand your feelings.  Contact a mental health care provider and work with this person regularly.  Make a safety plan that you can follow during a crisis. Include phone numbers of suicide prevention hotlines, mental health professionals, and trusted friends and family members you can call during an emergency. Save these numbers on your phone.  If you are thinking of taking a lot of medicine, give your medicine to someone who can give it to you as prescribed. If you are on antidepressants and are concerned you will overdose, tell your health care provider so that he or she can give you safer medicines.  Try to stick to your routines and follow a schedule every day. Make self-care a priority.  Make a list of realistic goals, and cross them off when you achieve them. Accomplishments can give you a sense of worth.  Wait until you are feeling better before doing things that you find difficult or unpleasant.  Do things that you have always enjoyed to take your mind off your feelings. Try reading a book, or listening to or playing music. Spending time outside, in nature, may help you feel better.   Follow these instructions at home:  Visit your primary health care provider every year for a checkup.  Work with a mental health care provider as needed.  Eat a well-balanced diet, and eat regular meals.  Get plenty of rest.  Exercise if you are able. Just 30 minutes of  exercise each day can help you feel better.  Take over-the-counter and prescription medicines only as told by your health care provider. Ask your mental health care provider about the possible side effects of any medicines you are taking.  Do not use alcohol or drugs, and  remove these substances from your home.  Remove weapons, poisons, knives, and other deadly items from your home.   General recommendations  Keep your living space well lit.  When you are feeling well, write yourself a letter with tips and support that you can read when you are not feeling well.  Remember that life's difficulties can be sorted out with help. Conditions can be treated, and you can learn behaviors and ways of thinking that will help you. Where to find more information  National Suicide Prevention Lifeline: www.suicidepreventionlifeline.org  Hopeline: www.hopeline.[place] for Suicide Prevention: PromotionalLoans.co.za  The [employer] (for lesbian, gay, bisexual, transgender, or questioning youth): www.thetrevorproject.[employer] of Mental Health: http://bridges.com/ Contact a health care provider if:  You feel as though you are a burden to others.  You feel agitated, angry, vengeful, or have extreme mood swings.  You have withdrawn from family and friends. Get help right away if:  You are talking about suicide or wishing to die.  You start making plans for how to commit suicide.  You feel that you have no reason to live.  You start making plans for putting your affairs in order, saying goodbye, or giving your possessions away.  You feel guilt, shame, or unbearable pain, and it seems like there is no way out.  You are frequently using drugs or alcohol.  You are engaging in risky behaviors that could lead to death. If you have any of these symptoms, get help right away. Call emergency services, go to your nearest emergency department or crisis center, or call a suicide crisis helpline. Summary  Suicide is when you take your own life.  Promise yourself that you will not do anything extreme when you have suicidal feelings.  Let family, friends, teachers, or counselors know how you are  feeling.  Get help right away if you start making plans for how to commit suicide. This information is not intended to replace advice given to you by your health care provider. Make sure you discuss any questions you have with your health care provider. Document Revised: 06/03/2020 Document Reviewed: 06/03/2020 Elsevier Patient Education  2021 [employer].

## 2021-02-24 NOTE — Progress Notes (Signed)
Date:  02/24/2021   Name:  Max Moore Tomlin Jr.   DOB:  Jul 02, 1976   MRN:  [id number]   Chief Complaint: Establish Care and Excessive Sweating (X5 years, Cramping when he starts sweating, starts in hands, headache then full body cramping) Max [name]. is a 45 y.o. male who presents today for his Complete Annual Exam. He feels fairly well. 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 He reports he is sleeping fairly well.   Colonoscopy: 2018 - 3 hyperplastic polyps   There is no immunization history on file for this patient.   Gastroesophageal Reflux He complains of heartburn. He reports no abdominal pain, no chest pain, no choking or no wheezing. This is a recurrent problem. The problem occurs occasionally. Pertinent negatives include no fatigue. He has tried a histamine-2 antagonist for the symptoms.  Depression        This is a chronic problem.  The problem has been waxing and waning since onset.  Associated symptoms include no fatigue, no appetite change, no myalgias and no headaches.     The symptoms are aggravated by family issues, work stress and social issues.  Past treatments include nothing.  Tobacco use - he smokes 1 ppd and has done so for many years.  He is not very interested in quitting but thinks that he could.  He denies chronic cough, SOB, sputum production.  Alcohol use - he consumes beer daily; from 1 to 12 depending on the day.  He denies dependency.  He admits that he uses it for stress reduction.   Muscle cramps - when he worked outside in [employer]ical [employer] he perspired heavily and had frequent severe muscle cramps all over his body.  He drinks sufficient water but was not drinking gatorade like beverages often.  He has stopped that line of work and has not been bothered with cramps.  Lab Results  Component Value Date   CREATININE 1.41 (H) 01/15/2013   BUN 16 01/15/2013   NA 140 01/15/2013   K 3.9 01/15/2013   CL 103 01/15/2013   CO2 30 01/15/2013   No  results found for: CHOL, HDL, LDLCALC, LDLDIRECT, TRIG, CHOLHDL No results found for: TSH No results found for: HGBA1C Lab Results  Component Value Date   WBC 4.2 01/15/2013   HGB 15.0 01/15/2013   HCT 43.8 01/15/2013   MCV 85.4 01/15/2013   PLT 178 01/15/2013   Lab Results  Component Value Date   ALT 16 01/15/2013   AST 19 01/15/2013   ALKPHOS 70 01/15/2013   BILITOT 1.0 01/15/2013     Review of Systems  Constitutional: Negative for appetite change, chills, diaphoresis, fatigue and unexpected weight change.  HENT: Negative for hearing loss, tinnitus, trouble swallowing and voice change.   Eyes: Negative for visual disturbance.  Respiratory: Negative for choking, shortness of breath and wheezing.   Cardiovascular: Negative for chest pain, palpitations and leg swelling.  Gastrointestinal: Positive for heartburn. Negative for abdominal pain, blood in stool, constipation and diarrhea.  Genitourinary: Negative for difficulty urinating, dysuria and frequency.  Musculoskeletal: Negative for arthralgias, back pain and myalgias.  Skin: Negative for color change and rash.  Neurological: Negative for dizziness, syncope and headaches.  Hematological: Negative for adenopathy.  Psychiatric/Behavioral: Positive for depression. Negative for dysphoric mood and sleep disturbance.    Patient Active Problem List   Diagnosis Date Noted  . Essential hypertension 11/23/2016    Allergies  Allergen Reactions  . Anesthetics, Halogenated Nausea And Vomiting  and Cough    Past Surgical History:  Procedure Laterality Date  . COLONOSCOPY  2018  . HAND TENDON SURGERY Right     Social History   Tobacco Use  . Smoking status: Current Every Day Smoker    Packs/day: 0.03    Years: 22.00    Pack years: 0.66    Types: Cigars  . Smokeless tobacco: Never Used  Vaping Use  . Vaping Use: Never used  Substance Use Topics  . Alcohol use: Yes    Comment: occasional  . Drug use: Yes    Types:  Marijuana     Medication list has been reviewed and updated.  No outpatient medications have been marked as taking for the 02/24/21 encounter (Office Visit) with Glean Hess, MD.   Current Facility-Administered Medications for the 02/24/21 encounter (Office Visit) with Glean Hess, MD  Medication  . 0.9 %  sodium chloride infusion    PHQ 2/9 Scores 02/24/2021  PHQ - 2 Score 2  PHQ- 9 Score 6    GAD 7 : Generalized Anxiety Score 02/24/2021 02/24/2021  Nervous, Anxious, on Edge 0 0  Control/stop worrying 0 0  Worry too much - different things 1 0  Trouble relaxing 0 0  Restless 0 0  Easily annoyed or irritable 3 0  Afraid - awful might happen 0 0  Total GAD 7 Score 4 0  Anxiety Difficulty Not difficult at all -    BP Readings from Last 3 Encounters:  02/24/21 122/68  07/12/18 (!) 139/96  01/03/17 (!) 146/78    Physical Exam Vitals and nursing note reviewed.  Constitutional:      Appearance: Normal appearance. He is well-developed.  HENT:     Head: Normocephalic.     Right Ear: Tympanic membrane, ear canal and external ear normal.     Left Ear: Tympanic membrane, ear canal and external ear normal.     Nose: Nose normal.  Eyes:     Conjunctiva/sclera: Conjunctivae normal.     Pupils: Pupils are equal, round, and reactive to light.  Neck:     Thyroid: No thyromegaly.     Vascular: No carotid bruit.  Cardiovascular:     Rate and Rhythm: Normal rate and regular rhythm.     Heart sounds: Normal heart sounds.  Pulmonary:     Effort: Pulmonary effort is normal.     Breath sounds: Normal breath sounds. No wheezing.  Chest:  Breasts:     Right: No mass.     Left: No mass.    Abdominal:     General: Bowel sounds are normal.     Palpations: Abdomen is soft.     Tenderness: There is no abdominal tenderness.  Musculoskeletal:        General: Normal range of motion.     Cervical back: Normal range of motion and neck supple.  Lymphadenopathy:     Cervical:  No cervical adenopathy.  Skin:    General: Skin is warm and dry.  Neurological:     Mental Status: He is alert and oriented to person, place, and time.     Deep Tendon Reflexes: Reflexes are normal and symmetric.  Psychiatric:        Attention and Perception: Attention normal.        Mood and Affect: Mood normal.        Thought Content: Thought content normal.     Wt Readings from Last 3 Encounters:  02/24/21 192 lb (87.1 kg)  07/12/18 205 lb (93 kg)  01/03/17 195 lb (88.5 kg)    BP 122/68   Pulse 86   Temp 98.2 F (36.8 C) (Oral)   Ht 6' (1.829 m)   Wt 192 lb (87.1 kg)   SpO2 97%   BMI 26.04 kg/m   Assessment and Plan: 1. Annual physical exam Normal exam; encourage exercise and healthy diet He needs to consume Gatorade or similar regularly while working in the heat - Comprehensive metabolic panel - Lipid panel - TSH  2. Gastroesophageal reflux disease, unspecified whether esophagitis present Symptoms intermittent and related to foods No red flag signs such as weight loss, n/v, melena Will continue PRN otc zantac.  Limit use of Aleve, Advil, BCs, etc. - CBC with Differential/Platelet  3. Tobacco use disorder Encouraged pt to cut back and quit He does not qualify for LDCT screening  4. Prostate cancer screening DRE deferred - PSA  5. Need for hepatitis C screening test - Hepatitis C antibody  6. Alcohol abuse, daily use Discussed alcohol intake and rational for reducing use He admits that it is a stress reducer and sleep aid Will obtain liver functions tests  7. Mood disorder ([place]) He is not interested in counseling or medication at this time - he feels that it would just add extra financial strain. He has struggled with this for years.  He has no clear plan for suicide but admits to taking an overdose of pills in the past.  He was hospitalized but never treated for depression. Suicide hotline number discussed and provided.   Partially dictated using  Editor, commissioning. Any errors are unintentional.  Halina Maidens, MD [facility]  02/24/2021

## 2021-03-22 LAB — CBC WITH DIFFERENTIAL/PLATELET
Basophils Absolute: 0 10*3/uL (ref 0.0–0.2)
Basos: 1 %
EOS (ABSOLUTE): 0.2 10*3/uL (ref 0.0–0.4)
Eos: 3 %
Hematocrit: 47.1 % (ref 37.5–51.0)
Hemoglobin: 15.7 g/dL (ref 13.0–17.7)
Immature Grans (Abs): 0 10*3/uL (ref 0.0–0.1)
Immature Granulocytes: 0 %
Lymphocytes Absolute: 1.7 10*3/uL (ref 0.7–3.1)
Lymphs: 30 %
MCH: 28.8 pg (ref 26.6–33.0)
MCHC: 33.3 g/dL (ref 31.5–35.7)
MCV: 86 fL (ref 79–97)
Monocytes Absolute: 0.4 10*3/uL (ref 0.1–0.9)
Monocytes: 7 %
Neutrophils Absolute: 3.3 10*3/uL (ref 1.4–7.0)
Neutrophils: 59 %
Platelets: 198 10*3/uL (ref 150–450)
RBC: 5.45 x10E6/uL (ref 4.14–5.80)
RDW: 13.7 % (ref 11.6–15.4)
WBC: 5.6 10*3/uL (ref 3.4–10.8)

## 2021-03-22 LAB — LIPID PANEL
Chol/HDL Ratio: 3.4 ratio (ref 0.0–5.0)
Cholesterol, Total: 172 mg/dL (ref 100–199)
HDL: 50 mg/dL (ref >39)
LDL Chol Calc (NIH): 97 mg/dL (ref 0–99)
Triglycerides: 142 mg/dL (ref 0–149)
VLDL Cholesterol Cal: 25 mg/dL (ref 5–40)

## 2021-03-22 LAB — COMPREHENSIVE METABOLIC PANEL
ALT: 17 IU/L (ref 0–44)
AST: 16 IU/L (ref 0–40)
Albumin/Globulin Ratio: 1.9 (ref 1.2–2.2)
Albumin: 4.6 g/dL (ref 4.0–5.0)
Alkaline Phosphatase: 67 IU/L (ref 44–121)
BUN/Creatinine Ratio: 12 (ref 9–20)
BUN: 14 mg/dL (ref 6–24)
Bilirubin Total: 0.7 mg/dL (ref 0.0–1.2)
CO2: 20 mmol/L (ref 20–29)
Calcium: 9.5 mg/dL (ref 8.7–10.2)
Chloride: 106 mmol/L (ref 96–106)
Creatinine, Ser: 1.16 mg/dL (ref 0.76–1.27)
Globulin, Total: 2.4 g/dL (ref 1.5–4.5)
Glucose: 100 mg/dL — ABNORMAL HIGH (ref 65–99)
Potassium: 4.1 mmol/L (ref 3.5–5.2)
Sodium: 145 mmol/L — ABNORMAL HIGH (ref 134–144)
Total Protein: 7 g/dL (ref 6.0–8.5)
eGFR: 80 mL/min/{1.73_m2} (ref >59)

## 2021-03-22 LAB — PSA: Prostate Specific Ag, Serum: 0.5 ng/mL (ref 0.0–4.0)

## 2021-03-22 LAB — HEPATITIS C ANTIBODY: Hep C Virus Ab: <0.1 s/co ratio (ref 0.0–0.9)

## 2021-03-22 LAB — TSH: TSH: 3.19 u[IU]/mL (ref 0.450–4.500)

## 2021-08-01 ENCOUNTER — Ambulatory Visit
Admission: EM | Admit: 2021-08-01 | Discharge: 2021-08-01 | Disposition: A | Discharge status: Home / Self Care | Payer: Managed Care, Other (non HMO)

## 2021-08-01 ENCOUNTER — Encounter (HOSPITAL_COMMUNITY): Payer: Self-pay

## 2021-08-01 ENCOUNTER — Emergency Department (HOSPITAL_COMMUNITY)
Admission: EM | Admit: 2021-08-01 | Discharge: 2021-08-01 | Disposition: A | Discharge status: Home / Self Care | Payer: Managed Care, Other (non HMO) | Attending: Emergency Medicine | Admitting: Emergency Medicine

## 2021-08-01 DIAGNOSIS — Z5321 Procedure and treatment not carried out due to patient leaving prior to being seen by health care provider: Secondary | ICD-10-CM | POA: Diagnosis not present

## 2021-08-01 DIAGNOSIS — M25522 Pain in left elbow: Secondary | ICD-10-CM | POA: Insufficient documentation

## 2021-08-01 NOTE — ED Triage Notes (Signed)
Pt. States their left elbow hurts x 3 weeks. Pt. Denies any recent injuries.

## 2021-08-02 ENCOUNTER — Other Ambulatory Visit: Payer: Self-pay

## 2021-08-02 ENCOUNTER — Encounter (HOSPITAL_COMMUNITY): Payer: Self-pay

## 2021-08-02 ENCOUNTER — Emergency Department (HOSPITAL_COMMUNITY)
Admission: EM | Admit: 2021-08-02 | Discharge: 2021-08-02 | Disposition: A | Discharge status: Home / Self Care | Payer: Managed Care, Other (non HMO) | Attending: Emergency Medicine | Admitting: Emergency Medicine

## 2021-08-02 ENCOUNTER — Emergency Department (HOSPITAL_COMMUNITY): Payer: Managed Care, Other (non HMO)

## 2021-08-02 DIAGNOSIS — F17210 Nicotine dependence, cigarettes, uncomplicated: Secondary | ICD-10-CM | POA: Diagnosis not present

## 2021-08-02 DIAGNOSIS — M778 Other enthesopathies, not elsewhere classified: Secondary | ICD-10-CM | POA: Insufficient documentation

## 2021-08-02 DIAGNOSIS — I10 Essential (primary) hypertension: Secondary | ICD-10-CM | POA: Insufficient documentation

## 2021-08-02 MED ORDER — CYCLOBENZAPRINE HCL 10 MG PO TABS: 10.0000 mg | ORAL_TABLET | Freq: Two times a day (BID) | ORAL | 0 refills | Status: DC | PRN

## 2021-08-02 MED ORDER — IBUPROFEN 600 MG PO TABS: 600.0000 mg | ORAL_TABLET | Freq: Four times a day (QID) | ORAL | 0 refills | Status: DC | PRN

## 2021-08-02 NOTE — ED Triage Notes (Signed)
Pt presents to ED with left elbow pain x 3.5 weeks. NKI

## 2021-08-02 NOTE — ED Provider Notes (Signed)
[facility] Provider Note   CSN: [id number] Arrival date & time: 08/02/21  1105     History Chief Complaint  Patient presents with   Elbow Pain    Max [name]. is a 45 y.o. male.  The history is provided by the patient. No language interpreter was used.   45 year old male who presents for evaluation of left elbow pain.  Patient report for nearly a month he has been having pain about his left shoulder.  He described pain as a sharp shooting throbbing sensation worse with certain movement especially when he moves his hand.  Pain is moderate in severity mildly improved wearing elbow compression and taking over-the-counter medication.  He denies any shoulder pain or neck pain.  No chest pain.  He is ambidextrous.  He does do a lot of repetitive work but denies any specific injury.  He denies numbness or weakness.  No fevers or chills  History reviewed. No pertinent past medical history.  Patient Active Problem List   Diagnosis Date Noted   Essential hypertension 11/23/2016    Past Surgical History:  Procedure Laterality Date   COLONOSCOPY  2018   HAND TENDON SURGERY Right        Family History  Problem Relation Age of Onset   Breast cancer Mother    Leukemia Father    Diabetes Sister    Colon cancer Paternal Grandfather    Diabetes Brother        from gun  shot   Heart disease Maternal Grandmother    Heart attack Maternal Grandmother    Colon cancer Paternal Aunt    Colon cancer Paternal Uncle        x 4   Prostate cancer Paternal Uncle        x 2   Lupus Maternal Uncle        x 2   Kidney failure Maternal Uncle     Social History   Tobacco Use   Smoking status: Every Day    Packs/day: 1.00    Years: 20.00    Pack years: 20.00    Types: Cigars, Cigarettes   Smokeless tobacco: Never  Vaping Use   Vaping Use: Never used  Substance Use Topics   Alcohol use: Yes    Alcohol/week: 60.0 standard drinks    Types: 60 Standard  drinks or equivalent per week    Comment: 7-9 drinks daily    Drug use: Yes    Types: Marijuana    Home Medications Prior to Admission medications   Not on File    Allergies    Anesthetics, halogenated  Review of Systems   Review of Systems  Constitutional:  Negative for fever.  Musculoskeletal:  Positive for arthralgias.  Neurological:  Negative for numbness.   Physical Exam Updated Vital Signs BP (!) 166/96 (BP Location: Right Arm)   Pulse 83   Temp 98 F (36.7 C) (Oral)   Resp 19   Ht 6' (1.829 m)   Wt 87.8 kg   SpO2 99%   BMI 26.26 kg/m   Physical Exam Vitals and nursing note reviewed.  Constitutional:      General: He is not in acute distress.    Appearance: He is well-developed.  HENT:     Head: Atraumatic.  Eyes:     Conjunctiva/sclera: Conjunctivae normal.  Musculoskeletal:        General: Tenderness (Left elbow: Tenderness along the brachial radialis region on palpation without any overlying skin  changes warmth or erythema.  Normal elbow flexion and extension.) present.     Cervical back: Neck supple.     Comments: Left shoulder and left wrist nontender, radial pulse 2+, normal grip strength to left hand  Skin:    Findings: No rash.  Neurological:     Mental Status: He is alert.    ED Results / Procedures / Treatments   Labs (all labs ordered are listed, but only abnormal results are displayed) Labs Reviewed - No data to display  EKG None  Radiology DG Elbow Complete Left  Result Date: 08/02/2021 CLINICAL DATA:  Pain EXAM: LEFT ELBOW - COMPLETE 3+ VIEW COMPARISON:  None. FINDINGS: There is no evidence of fracture, dislocation, or joint effusion. There is no evidence of arthropathy or other focal bone abnormality. Soft tissues are unremarkable. IMPRESSION: No radiographic abnormality is seen in the left elbow. Electronically Signed   By: Elmer Picker M.D.   On: 08/02/2021 11:40    Procedures Procedures   Medications Ordered in  ED Medications - No data to display  ED Course  I have reviewed the triage vital signs and the nursing notes.  Pertinent labs & imaging results that were available during my care of the patient were reviewed by me and considered in my medical decision making (see chart for details).    MDM Rules/Calculators/A&P                           BP (!) 166/96 (BP Location: Right Arm)   Pulse 83   Temp 98 F (36.7 C) (Oral)   Resp 19   Ht 6' (1.829 m)   Wt 87.8 kg   SpO2 99%   BMI 26.26 kg/m   Final Clinical Impression(s) / ED Diagnoses Final diagnoses:  Left elbow tendinitis    Rx / DC Orders ED Discharge Orders          Ordered    ibuprofen (ADVIL) 600 MG tablet  Every 6 hours PRN        08/02/21 1218    cyclobenzaprine (FLEXERIL) 10 MG tablet  2 times daily PRN        08/02/21 1218           Patient here with left elbow pain ongoing for nearly a month.  No recent trauma.  No signs of infection on exam.  X-ray of the left elbow unremarkable.  I suspect this is likely to be elbow tendinitis, recommend appropriate treatment and will give referral to hand specialist.  Patient voiced understanding and agrees with plan.   Domenic Moras, PA-C 08/02/21 1220    Milton Ferguson, MD 08/04/21 [phone]

## 2021-08-02 NOTE — Discharge Instructions (Addendum)
Your pain is likely due to tendinitis.  Please take medication prescribed as needed for pain.  Call and follow-up closely with orthopedist for further managements of your condition.  You may benefit from compression band to your forearm to help aid healing.  Avoid repetitive movement.

## 2021-09-01 ENCOUNTER — Other Ambulatory Visit: Payer: Self-pay

## 2021-09-01 ENCOUNTER — Ambulatory Visit
Admission: RE | Admit: 2021-09-01 | Discharge: 2021-09-01 | Disposition: A | Discharge status: Home / Self Care | Payer: Managed Care, Other (non HMO) | Attending: Internal Medicine | Admitting: Internal Medicine

## 2021-09-01 ENCOUNTER — Encounter: Payer: Self-pay | Admitting: Internal Medicine

## 2021-09-01 ENCOUNTER — Ambulatory Visit: Payer: Managed Care, Other (non HMO) | Admitting: Internal Medicine

## 2021-09-01 ENCOUNTER — Ambulatory Visit
Admission: RE | Admit: 2021-09-01 | Discharge: 2021-09-01 | Disposition: A | Discharge status: Home / Self Care | Payer: Managed Care, Other (non HMO) | Source: Ambulatory Visit | Attending: Internal Medicine | Admitting: Internal Medicine

## 2021-09-01 VITALS — BP 130/88 | HR 81 | Ht 72.0 in | Wt 195.0 lb

## 2021-09-01 DIAGNOSIS — M25551 Pain in right hip: Secondary | ICD-10-CM | POA: Diagnosis not present

## 2021-09-01 DIAGNOSIS — M778 Other enthesopathies, not elsewhere classified: Secondary | ICD-10-CM | POA: Diagnosis not present

## 2021-09-01 DIAGNOSIS — F17200 Nicotine dependence, unspecified, uncomplicated: Secondary | ICD-10-CM | POA: Diagnosis not present

## 2021-09-01 MED ORDER — PREDNISONE 10 MG PO TABS: ORAL_TABLET | ORAL | 0 refills | Status: AC

## 2021-09-01 NOTE — Progress Notes (Signed)
Date:  09/01/2021   Name:  Max Moore Zillmer Jr.   DOB:  September 06, 1976   MRN:  [id number]   Chief Complaint: Arm Pain (/) and Hip Pain  Arm Pain  Incident onset: 2 months. There was no injury mechanism. The pain is present in the left elbow. The quality of the pain is described as aching. Radiates to: to forearm. Pain scale: 7 right now and 10 when it gets bad. The pain is moderate. The pain has been Worsening since the incident. Pertinent negatives include no chest pain. Nothing aggravates the symptoms. He has tried acetaminophen, ice and NSAIDs for the symptoms. The treatment provided mild relief.  Hip Pain  Incident onset: 2.5 weeks. There was no injury mechanism. The pain is present in the right hip. The quality of the pain is described as shooting and stabbing. The pain is at a severity of 10/10. The pain is moderate. The pain has been Intermittent since onset. He reports no foreign bodies present. The symptoms are aggravated by movement. He has tried acetaminophen, ice and heat for the symptoms. The treatment provided mild relief.   Lab Results  Component Value Date   NA 145 (H) 03/21/2021   K 4.1 03/21/2021   CO2 20 03/21/2021   GLUCOSE 100 (H) 03/21/2021   BUN 14 03/21/2021   CREATININE 1.16 03/21/2021   CALCIUM 9.5 03/21/2021   EGFR 80 03/21/2021   GFRNONAA 63 (L) 01/15/2013   Lab Results  Component Value Date   CHOL 172 03/21/2021   HDL 50 03/21/2021   LDLCALC 97 03/21/2021   TRIG 142 03/21/2021   CHOLHDL 3.4 03/21/2021   Lab Results  Component Value Date   TSH 3.190 03/21/2021   No results found for: HGBA1C Lab Results  Component Value Date   WBC 5.6 03/21/2021   HGB 15.7 03/21/2021   HCT 47.1 03/21/2021   MCV 86 03/21/2021   PLT 198 03/21/2021   Lab Results  Component Value Date   ALT 17 03/21/2021   AST 16 03/21/2021   ALKPHOS 67 03/21/2021   BILITOT 0.7 03/21/2021   No results found for: 25OHVITD2, 25OHVITD3, VD25OH   Review of Systems   Constitutional:  Negative for chills, fatigue and fever.  Respiratory:  Negative for chest tightness and shortness of breath.   Cardiovascular:  Negative for chest pain.  Musculoskeletal:  Positive for arthralgias. Negative for joint swelling.  Neurological:  Negative for dizziness and headaches.   Patient Active Problem List   Diagnosis Date Noted   Essential hypertension 11/23/2016    Allergies  Allergen Reactions   Anesthetics, Halogenated Nausea And Vomiting and Cough    Past Surgical History:  Procedure Laterality Date   COLONOSCOPY  2018   HAND TENDON SURGERY Right     Social History   Tobacco Use   Smoking status: Every Day    Packs/day: 1.00    Years: 20.00    Pack years: 20.00    Types: Cigars, Cigarettes   Smokeless tobacco: Never  Vaping Use   Vaping Use: Never used  Substance Use Topics   Alcohol use: Yes    Alcohol/week: 60.0 standard drinks    Types: 60 Standard drinks or equivalent per week    Comment: 7-9 drinks daily    Drug use: Yes    Types: Marijuana     Medication list has been reviewed and updated.  Current Meds  Medication Sig   ibuprofen (ADVIL) 600 MG tablet Take 1 tablet (600  mg total) by mouth every 6 (six) hours as needed.   [DISCONTINUED] cyclobenzaprine (FLEXERIL) 10 MG tablet Take 1 tablet (10 mg total) by mouth 2 (two) times daily as needed for muscle spasms.    PHQ 2/9 Scores 02/24/2021  PHQ - 2 Score 2  PHQ- 9 Score 6    GAD 7 : Generalized Anxiety Score 02/24/2021 02/24/2021  Nervous, Anxious, on Edge 0 0  Control/stop worrying 0 0  Worry too much - different things 1 0  Trouble relaxing 0 0  Restless 0 0  Easily annoyed or irritable 3 0  Afraid - awful might happen 0 0  Total GAD 7 Score 4 0  Anxiety Difficulty Not difficult at all -    BP Readings from Last 3 Encounters:  09/01/21 130/88  08/02/21 136/90  08/01/21 (!) 150/112    Physical Exam Vitals and nursing note reviewed.  Constitutional:       General: He is not in acute distress.    Appearance: He is well-developed.  HENT:     Head: Normocephalic and atraumatic.  Cardiovascular:     Rate and Rhythm: Normal rate and regular rhythm.  Pulmonary:     Effort: Pulmonary effort is normal. No respiratory distress.     Breath sounds: No wheezing or rhonchi.  Musculoskeletal:     Right elbow: Normal.     Left elbow: Swelling (lateral antecubital fossa) present. Decreased range of motion. Tenderness present.     Cervical back: Normal range of motion.     Right hip: Bony tenderness present. Decreased range of motion (moderate pain with internal and external rotation). Decreased strength.     Left hip: Normal.  Lymphadenopathy:     Cervical: No cervical adenopathy.  Skin:    General: Skin is warm and dry.     Findings: No rash.  Neurological:     Mental Status: He is alert and oriented to person, place, and time.  Psychiatric:        Mood and Affect: Mood normal.        Behavior: Behavior normal.    Wt Readings from Last 3 Encounters:  09/01/21 195 lb (88.5 kg)  08/02/21 193 lb 9.6 oz (87.8 kg)  08/01/21 198 lb (89.8 kg)    BP 130/88   Pulse 81   Ht 6' (1.829 m)   Wt 195 lb (88.5 kg)   SpO2 96%   BMI 26.45 kg/m   Assessment and Plan: 1. Left elbow tendonitis Has not responded to Advil, ice, and rest. Will do steroid taper and refer to SM or Ortho - predniSONE (DELTASONE) 10 MG tablet; Take 6 on day 1and 2, 5 on day 3 and 4, 4 on day 5 and 6 , 3 on day 7 and 8, 2 on day 9 and 10 and 1 on day 11 and 12 then stop.  Dispense: 42 tablet; Refill: 0  2. Acute right hip pain Moderately severe pain with only modest passive movement and minimal pain to palpation Will obtain imaging to rule out occult fx, etc Any inflammation should respond to the steroid taper above. - DG HIP UNILAT WITH PELVIS 2-3 VIEWS RIGHT  3. Tobacco use disorder Counseling given.   Partially dictated using Editor, commissioning. Any errors are  unintentional.  Halina Maidens, MD [facility]  09/01/2021

## 2021-09-04 ENCOUNTER — Ambulatory Visit: Payer: Self-pay

## 2021-09-04 NOTE — Telephone Encounter (Signed)
Please review.  KP

## 2021-09-04 NOTE — Telephone Encounter (Signed)
Spoke to patient he stated that he is taken medication with meals. Per Dr. Army Melia told pt to space out his medication. On day 3 and 4  pt is supposed to take 5 tablets. Pt can take 1 at breakfast 2 at lunch and 2 at dinner. As long as pt is taking correct amount of tablets a day pt can split it up with meals. Pt verbalized understanding.  KP

## 2021-09-04 NOTE — Telephone Encounter (Signed)
The patient has called regarding their recent prescription for predniSONE (DELTASONE) 10 MG tablet [[id number]]   The patient shares that the medication has caused vomiting which has made them feel unwell for roughly 2 days   The patient would like to discuss their side effects with a member of staff and has also requested an alternative prescription   Please contact further when available   Pt. Reports as soon as he started taking Prednisone, he started vomiting. Took it x 2 days."It helped my elbow, but I didn't take anymore today.I can't take anymore." Asking for a different medication. Please advise . Pt. Stopped medication today and has not vomited today.    Answer Assessment - Initial Assessment Questions 1. NAME of MEDICATION: "What medicine are you calling about?"     Prednisone 2. QUESTION: "What is your question?" (e.g., double dose of medicine, side effect)     Side effect 3. PRESCRIBING HCP: "Who prescribed it?" Reason: if prescribed by specialist, call should be referred to that group.     Dr. Army Melia 4. SYMPTOMS: "Do you have any symptoms?"     Vomiting 5. SEVERITY: If symptoms are present, ask "Are they mild, moderate or severe?"     Severe 6. PREGNANCY:  "Is there any chance that you are pregnant?" "When was your last menstrual period?"     N/a  Protocols used: Medication Question Call-A-AH

## 2021-12-08 ENCOUNTER — Ambulatory Visit: Payer: Managed Care, Other (non HMO) | Admitting: Internal Medicine

## 2022-02-22 ENCOUNTER — Ambulatory Visit (INDEPENDENT_AMBULATORY_CARE_PROVIDER_SITE_OTHER): Payer: Managed Care, Other (non HMO) | Admitting: Internal Medicine

## 2022-02-22 ENCOUNTER — Encounter: Payer: Self-pay | Admitting: Internal Medicine

## 2022-02-22 VITALS — BP 124/84 | HR 78 | Temp 97.5°F | Resp 16 | Ht 72.0 in | Wt 201.1 lb

## 2022-02-22 DIAGNOSIS — Z8042 Family history of malignant neoplasm of prostate: Secondary | ICD-10-CM

## 2022-02-22 DIAGNOSIS — Z23 Encounter for immunization: Secondary | ICD-10-CM | POA: Diagnosis not present

## 2022-02-22 DIAGNOSIS — Z13220 Encounter for screening for lipoid disorders: Secondary | ICD-10-CM

## 2022-02-22 DIAGNOSIS — K219 Gastro-esophageal reflux disease without esophagitis: Secondary | ICD-10-CM | POA: Diagnosis not present

## 2022-02-22 DIAGNOSIS — H60502 Unspecified acute noninfective otitis externa, left ear: Secondary | ICD-10-CM | POA: Diagnosis not present

## 2022-02-22 DIAGNOSIS — H9202 Otalgia, left ear: Secondary | ICD-10-CM | POA: Diagnosis not present

## 2022-02-22 MED ORDER — PANTOPRAZOLE SODIUM 40 MG PO TBEC: 40.0000 mg | DELAYED_RELEASE_TABLET | Freq: Every day | ORAL | 3 refills | Status: DC

## 2022-02-22 MED ORDER — NEOMYCIN-POLYMYXIN-HC 3.5-10000-1 OT SOLN: 3.0000 [drp] | Freq: Four times a day (QID) | OTIC | 0 refills | Status: DC

## 2022-02-22 MED ORDER — AMOXICILLIN 875 MG PO TABS: 875.0000 mg | ORAL_TABLET | Freq: Two times a day (BID) | ORAL | 0 refills | Status: AC

## 2022-02-22 NOTE — Progress Notes (Signed)
New Patient Office Visit  Subjective    Patient ID: Max Moore Rugg Jr., male    DOB: 01-Aug-1976  Age: 46 y.o. MRN: [id number]  CC:  Chief Complaint  Patient presents with   Establish Care   Ear Pain    Left with swelling   Gastroesophageal Reflux    Lump near sternum difficulty swalling and sometimes vomiting    HPI Max Moore Mostek Jr. presents to establish care.  GERD: -Complaining of a palpable lump over his sternum  -Endorses acid reflux, difficulty swallowing with occasional nausea and vomiting.  -Currently on Zantac 75 mg daily. Over the counter Prilosec and Pepcid gave muscle aches  -Having symptoms for years. Works second shift, comes home around midnight, eats dinner but has trouble when he lays down to sleep around 1 am, tries to sleep elevated -Does have difficulty swallowing solids, feels like they get stuck in his throat. Particularly meats and breads. No issues with liquids.  -No abdominal pain, appetite decreased, weight stable   EAR PAIN Duration:  3 days Involved ear(s): left Severity:  severe  Quality:  ill-defined and throbbing Fever: no Otorrhea: no Upper respiratory infection symptoms: no Pruritus: no Hearing loss: yes Water immersion no Using Q-tips: yes Recurrent otitis media:  when he was kid  Status: worse Treatments attempted: none  Health Maintenance: -Blood work up to date -Tdap due  -Colonoscopy 01/03/2017, repeat in 10 years    Outpatient Encounter Medications as of 02/22/2022  Medication Sig   ranitidine (ZANTAC) 75 MG tablet Take 75 mg by mouth 2 (two) times daily.   [DISCONTINUED] ibuprofen (ADVIL) 600 MG tablet Take 1 tablet (600 mg total) by mouth every 6 (six) hours as needed.   No facility-administered encounter medications on file as of 02/22/2022.    History reviewed. No pertinent past medical history.  Past Surgical History:  Procedure Laterality Date   COLONOSCOPY  2018   HAND TENDON SURGERY Right      Family History  Problem Relation Age of Onset   Breast cancer Mother    Leukemia Father    Diabetes Sister    Colon cancer Paternal Grandfather    Diabetes Brother        from gun  shot   Heart disease Maternal Grandmother    Heart attack Maternal Grandmother    Colon cancer Paternal Aunt    Colon cancer Paternal Uncle        x 4   Prostate cancer Paternal Uncle        x 2   Lupus Maternal Uncle        x 2   Kidney failure Maternal Uncle     Social History   Socioeconomic History   Marital status: Single    Spouse name: Not on file   Number of children: 2   Years of education: Not on file   Highest education level: Not on file  Occupational History   Not on file  Tobacco Use   Smoking status: Every Day    Packs/day: 1.00    Years: 20.00    Pack years: 20.00    Types: Cigars, Cigarettes   Smokeless tobacco: Never  Vaping Use   Vaping Use: Never used  Substance and Sexual Activity   Alcohol use: Yes    Alcohol/week: 60.0 standard drinks    Types: 60 Standard drinks or equivalent per week    Comment: 2-3 daily   Drug use: Yes    Types: Marijuana  Sexual activity: Yes    Partners: Male  Other Topics Concern   Not on file  Social History Narrative   Works as a Educational psychologist    Not married    Two children - do not live with this    Social Determinants of Radio broadcast assistant Strain: Not on file  Food Insecurity: Not on file  Transportation Needs: Not on file  Physical Activity: Not on file  Stress: Not on file  Social Connections: Not on file  Intimate Partner Violence: Not on file    Review of Systems  Constitutional:  Negative for chills, fever and weight loss.  HENT:  Positive for ear pain. Negative for congestion, ear discharge, hearing loss, sinus pain, sore throat and tinnitus.   Respiratory:  Negative for cough.   Cardiovascular:  Negative for chest pain.  Gastrointestinal:  Positive for heartburn, nausea and vomiting.  Negative for abdominal pain, constipation and diarrhea.  Neurological:  Negative for dizziness and headaches.       Objective    BP 124/84   Pulse 78   Temp (!) 97.5 F (36.4 C)   Resp 16   Ht 6' (1.829 m)   Wt 201 lb 1.6 oz (91.2 kg)   SpO2 97%   BMI 27.27 kg/m   Physical Exam Constitutional:      Appearance: Normal appearance.  HENT:     Head: Normocephalic and atraumatic.     Right Ear: Tympanic membrane and external ear normal.     Left Ear: External ear normal.     Ears:     Comments: Right ear canal erythematous, left ear canal very swollen, hard to see TM but appears to be bulging. Surrounding soft tissues mildly swollen, painful to touch. No pain over mastoid.     Nose: Nose normal.     Mouth/Throat:     Mouth: Mucous membranes are moist.     Comments: Mild PND Eyes:     Conjunctiva/sclera: Conjunctivae normal.  Cardiovascular:     Rate and Rhythm: Normal rate and regular rhythm.  Pulmonary:     Effort: Pulmonary effort is normal.     Breath sounds: Normal breath sounds.  Abdominal:     General: Bowel sounds are normal. There is no distension.     Palpations: Abdomen is soft.     Tenderness: There is no abdominal tenderness. There is no guarding or rebound.  Musculoskeletal:     Cervical back: No tenderness.     Right lower leg: No edema.     Left lower leg: No edema.  Lymphadenopathy:     Cervical: No cervical adenopathy.  Skin:    General: Skin is warm and dry.  Neurological:     General: No focal deficit present.     Mental Status: He is alert. Mental status is at baseline.  Psychiatric:        Mood and Affect: Mood normal.        Behavior: Behavior normal.        Assessment & Plan:   1. Ear pain, left/Acute otitis externa of left ear, unspecified type: Left ear canal and surrounding tissues swollen and painful. TM difficult to visualize about appears to be bulging. Will treat with oral Amoxicillin BID for 1 week as well as steroid drops for  otitis externa. CBC, CMP ordered as well. Follow up in 2 weeks for recheck.  - CBC w/Diff/Platelet - COMPLETE METABOLIC PANEL WITH GFR - amoxicillin (AMOXIL) 875 MG  tablet; Take 1 tablet (875 mg total) by mouth 2 (two) times daily for 7 days.  Dispense: 14 tablet; Refill: 0 - neomycin-polymyxin-hydrocortisone (CORTISPORIN) OTIC solution; Place 3 drops into the left ear 4 (four) times daily.  Dispense: 10 mL; Refill: 0  2. Gastroesophageal reflux disease, unspecified whether esophagitis present: Uncontrolled. Will discontinue Zantac, start Protonix 40 mg daily. Follow up in a few weeks, if symptoms continuing will send to GI for EGD.   - pantoprazole (PROTONIX) 40 MG tablet; Take 1 tablet (40 mg total) by mouth daily.  Dispense: 30 tablet; Refill: 3  3. Family history of prostate cancer: PSA with above labs.  - PSA  4. Lipid screening: Patient is fasting today, check cholesterol.   - Lipid Profile  5. Need for Tdap vaccination: Tdap vaccine given today.   - Tdap vaccine greater than or equal to 7yo IM  Return in about 2 weeks (around 03/08/2022) for recheck ear.   Teodora Medici, DO

## 2022-02-22 NOTE — Patient Instructions (Addendum)
It was great seeing you today!  Plan discussed at today's visit: -Blood work ordered today, results will be uploaded to [place] acid reflux medication sent, take Protonix 40 mg once daily and stop Zantac -Antibiotic for ear sent as well, take twice a day for 1 week, use ear drops to help with swelling as well  Follow up in: 2 weeks   Take care and let us know if you have any questions or concerns prior to your next visit.  Dr. Rosana Berger

## 2022-02-23 LAB — COMPLETE METABOLIC PANEL WITH GFR
AG Ratio: 1.9 (calc) (ref 1.0–2.5)
ALT: 23 U/L (ref 9–46)
AST: 21 U/L (ref 10–40)
Albumin: 4.5 g/dL (ref 3.6–5.1)
Alkaline phosphatase (APISO): 66 U/L (ref 36–130)
BUN: 15 mg/dL (ref 7–25)
CO2: 27 mmol/L (ref 20–32)
Calcium: 9.4 mg/dL (ref 8.6–10.3)
Chloride: 109 mmol/L (ref 98–110)
Creat: 1.2 mg/dL (ref 0.60–1.29)
Globulin: 2.4 g/dL (calc) (ref 1.9–3.7)
Glucose, Bld: 86 mg/dL (ref 65–99)
Potassium: 4.6 mmol/L (ref 3.5–5.3)
Sodium: 143 mmol/L (ref 135–146)
Total Bilirubin: 0.7 mg/dL (ref 0.2–1.2)
Total Protein: 6.9 g/dL (ref 6.1–8.1)
eGFR: 76 mL/min/{1.73_m2} (ref >=60)

## 2022-02-23 LAB — LIPID PANEL
Cholesterol: 166 mg/dL (ref <200)
HDL: 46 mg/dL (ref >=40)
LDL Cholesterol (Calc): 88 mg/dL (calc)
Non-HDL Cholesterol (Calc): 120 mg/dL (calc) (ref <130)
Total CHOL/HDL Ratio: 3.6 (calc) (ref <5.0)
Triglycerides: 231 mg/dL — ABNORMAL HIGH (ref <150)

## 2022-02-23 LAB — CBC WITH DIFFERENTIAL/PLATELET
Absolute Monocytes: 319 cells/uL (ref 200–950)
Basophils Absolute: 32 cells/uL (ref 0–200)
Basophils Relative: 0.6 %
Eosinophils Absolute: 248 cells/uL (ref 15–500)
Eosinophils Relative: 4.6 %
HCT: 43 % (ref 38.5–50.0)
Hemoglobin: 14.6 g/dL (ref 13.2–17.1)
Lymphs Abs: 1550 cells/uL (ref 850–3900)
MCH: 29 pg (ref 27.0–33.0)
MCHC: 34 g/dL (ref 32.0–36.0)
MCV: 85.5 fL (ref 80.0–100.0)
MPV: 11.8 fL (ref 7.5–12.5)
Monocytes Relative: 5.9 %
Neutro Abs: 3251 cells/uL (ref 1500–7800)
Neutrophils Relative %: 60.2 %
Platelets: 186 10*3/uL (ref 140–400)
RBC: 5.03 10*6/uL (ref 4.20–5.80)
RDW: 14.2 % (ref 11.0–15.0)
Total Lymphocyte: 28.7 %
WBC: 5.4 10*3/uL (ref 3.8–10.8)

## 2022-02-23 LAB — PSA: PSA: 0.22 ng/mL (ref <=4.00)

## 2022-03-13 ENCOUNTER — Other Ambulatory Visit: Payer: Self-pay

## 2022-03-13 ENCOUNTER — Ambulatory Visit: Payer: Managed Care, Other (non HMO) | Admitting: Family Medicine

## 2022-03-13 ENCOUNTER — Encounter: Payer: Self-pay | Admitting: Family Medicine

## 2022-03-13 VITALS — BP 124/74 | HR 72 | Temp 98.0°F | Resp 16 | Ht 72.0 in | Wt 200.6 lb

## 2022-03-13 DIAGNOSIS — H60503 Unspecified acute noninfective otitis externa, bilateral: Secondary | ICD-10-CM

## 2022-03-13 DIAGNOSIS — K219 Gastro-esophageal reflux disease without esophagitis: Secondary | ICD-10-CM

## 2022-03-13 NOTE — Progress Notes (Signed)
    SUBJECTIVE:   CHIEF COMPLAINT / HPI:   Ear Pain - seen previously 5/25 for L ear pain and swelling x3 days. On exam had bulging L TM and erythematous R canal. Gave amoxicillin x1 week and corticosporin drops.  - has finished oral antibiotic, still using drop few times per day. - ear pain is better.  - using Qtips at least twice daily every day.   GERD - Previously seen 5/25 for same, had heartburn, difficulty swallowing solids and occasional N/V, refractory to zantac. Symptoms had been present for years, denied abd pain, decreased appetite, weight loss. Started on protonix at that visit. - Meds: protonix - Symptoms:  none.   - Denies chest pain, choking on food, difficulty swallowing, heartburn, and nausea denies dysphagia has not lost weight denies melena, hematochezia, hematemesis, and coffee ground emesis.  - Previous treatment: H2 antagonists. - no FH of GI cancers.    OBJECTIVE:   BP 124/74   Pulse 72   Temp 98 F (36.7 C) (Oral)   Resp 16   Ht 6' (1.829 m)   Wt 200 lb 9.6 oz (91 kg)   SpO2 99%   BMI 27.21 kg/m   Gen: well appearing, in NAD HEENT:  TM visible b/l without bulging, erythema, purulence. Bilateral external canal clear, erythematous.  Card: RRR Lungs: CTAB Abd: soft, +BS. TTP over xiphoid process, nonTTP throughout abdomen. Negative Murphy sign.  Ext: WWP, no edema   ASSESSMENT/PLAN:   Gastroesophageal reflux disease No red flags. Symptoms resolved with PPI, continue.    AOE, AOM AOM resolved. Still with some redness to bilateral ear canal, suspect mostly from trauma given repeated Qtip use, recommend avoidance. Can discontinue antibiotic drops. F/u prn.  Myles Gip, DO

## 2022-03-13 NOTE — Assessment & Plan Note (Signed)
No red flags. Symptoms resolved with PPI, continue.

## 2022-03-14 ENCOUNTER — Ambulatory Visit: Payer: Managed Care, Other (non HMO) | Admitting: Internal Medicine

## 2022-06-20 ENCOUNTER — Other Ambulatory Visit: Payer: Self-pay | Admitting: Internal Medicine

## 2022-06-20 DIAGNOSIS — K219 Gastro-esophageal reflux disease without esophagitis: Secondary | ICD-10-CM

## 2022-06-20 NOTE — Telephone Encounter (Signed)
Requested Prescriptions  Pending Prescriptions Disp Refills  . pantoprazole (PROTONIX) 40 MG tablet [Pharmacy Med Name: PANTOPRAZOLE '40MG'$  TABLETS] 30 tablet 3    Sig: TAKE 1 TABLET BY MOUTH DAILY     Gastroenterology: Proton Pump Inhibitors Passed - 06/20/2022  3:32 AM      Passed - Valid encounter within last 12 months    Recent Outpatient Visits          3 months ago Acute otitis externa of both ears, unspecified type   [place], DO   3 months ago Ear pain, left   [place], DO   9 months ago Left elbow tendonitis   [facility], Jesse Sans, MD   1 year ago Annual physical exam   [facility] at [facility], Jesse Sans, MD      Future Appointments            In 2 months Teodora Medici, [facility], [facility]

## 2022-09-11 NOTE — Progress Notes (Deleted)
Established Patient Office Visit  Subjective    Patient ID: Max Moore Mcdiarmid Jr., male    DOB: [birth year]  Age: 46 y.o. MRN: [id number]  CC:  No chief complaint on file.   HPI Max Moore Canizales Jr. presents to follow up on chronic medical conditions.   GERD: -Complaining of a palpable lump over his sternum  -Endorses acid reflux, difficulty swallowing with occasional nausea and vomiting.  -Started on Protonic 40 mg at LOV -Had been on Zantac 75 mg daily. Over the counter Prilosec and Pepcid caused muscle aches  -Having symptoms for years. Works second shift, comes home around midnight, eats dinner but has trouble when he lays down to sleep around 1 am, tries to sleep elevated -Does have difficulty swallowing solids, feels like they get stuck in his throat. Particularly meats and breads. No issues with liquids.  -No abdominal pain, appetite decreased, weight stable   Health Maintenance: -Blood work up to date -Colonoscopy 01/03/2017, repeat in 10 years    Outpatient Encounter Medications as of 09/12/2022  Medication Sig   neomycin-polymyxin-hydrocortisone (CORTISPORIN) OTIC solution Place 3 drops into the left ear 4 (four) times daily.   pantoprazole (PROTONIX) 40 MG tablet TAKE 1 TABLET BY MOUTH DAILY   No facility-administered encounter medications on file as of 09/12/2022.    No past medical history on file.  Past Surgical History:  Procedure Laterality Date   COLONOSCOPY  2018   HAND TENDON SURGERY Right     Family History  Problem Relation Age of Onset   Breast cancer Mother    Leukemia Father    Diabetes Sister    Colon cancer Paternal Grandfather    Diabetes Brother        from gun  shot   Heart disease Maternal Grandmother    Heart attack Maternal Grandmother    Colon cancer Paternal Aunt    Colon cancer Paternal Uncle        x 4   Prostate cancer Paternal Uncle        x 2   Lupus Maternal Uncle        x 2   Kidney failure Maternal Uncle      Social History   Socioeconomic History   Marital status: Single    Spouse name: Not on file   Number of children: 2   Years of education: Not on file   Highest education level: Not on file  Occupational History   Not on file  Tobacco Use   Smoking status: Every Day    Packs/day: 1.00    Years: 20.00    Total pack years: 20.00    Types: Cigars, Cigarettes   Smokeless tobacco: Never  Vaping Use   Vaping Use: Never used  Substance and Sexual Activity   Alcohol use: Yes    Alcohol/week: 60.0 standard drinks of alcohol    Types: 60 Standard drinks or equivalent per week    Comment: 2-3 daily   Drug use: Yes    Types: Marijuana   Sexual activity: Yes    Partners: Male  Other Topics Concern   Not on file  Social History Narrative   Works as a Educational psychologist    Not married    Two children - do not live with this    Social Determinants of Radio broadcast assistant Strain: Not on file  Food Insecurity: Not on file  Transportation Needs: Not on file  Physical Activity: Not on file  Stress:  Not on file  Social Connections: Not on file  Intimate Partner Violence: Not on file    Review of Systems  Constitutional:  Negative for chills, fever and weight loss.  HENT:  Positive for ear pain. Negative for congestion, ear discharge, hearing loss, sinus pain, sore throat and tinnitus.   Respiratory:  Negative for cough.   Cardiovascular:  Negative for chest pain.  Gastrointestinal:  Positive for heartburn, nausea and vomiting. Negative for abdominal pain, constipation and diarrhea.  Neurological:  Negative for dizziness and headaches.        Objective    There were no vitals taken for this visit.  Physical Exam Constitutional:      Appearance: Normal appearance.  HENT:     Head: Normocephalic and atraumatic.     Right Ear: Tympanic membrane and external ear normal.     Left Ear: External ear normal.     Ears:     Comments: Right ear canal erythematous,  left ear canal very swollen, hard to see TM but appears to be bulging. Surrounding soft tissues mildly swollen, painful to touch. No pain over mastoid.     Nose: Nose normal.     Mouth/Throat:     Mouth: Mucous membranes are moist.     Comments: Mild PND Eyes:     Conjunctiva/sclera: Conjunctivae normal.  Cardiovascular:     Rate and Rhythm: Normal rate and regular rhythm.  Pulmonary:     Effort: Pulmonary effort is normal.     Breath sounds: Normal breath sounds.  Abdominal:     General: Bowel sounds are normal. There is no distension.     Palpations: Abdomen is soft.     Tenderness: There is no abdominal tenderness. There is no guarding or rebound.  Musculoskeletal:     Cervical back: No tenderness.     Right lower leg: No edema.     Left lower leg: No edema.  Lymphadenopathy:     Cervical: No cervical adenopathy.  Skin:    General: Skin is warm and dry.  Neurological:     General: No focal deficit present.     Mental Status: He is alert. Mental status is at baseline.  Psychiatric:        Mood and Affect: Mood normal.        Behavior: Behavior normal.         Assessment & Plan:   1. Ear pain, left/Acute otitis externa of left ear, unspecified type: Left ear canal and surrounding tissues swollen and painful. TM difficult to visualize about appears to be bulging. Will treat with oral Amoxicillin BID for 1 week as well as steroid drops for otitis externa. CBC, CMP ordered as well. Follow up in 2 weeks for recheck.  - CBC w/Diff/Platelet - COMPLETE METABOLIC PANEL WITH GFR - amoxicillin (AMOXIL) 875 MG tablet; Take 1 tablet (875 mg total) by mouth 2 (two) times daily for 7 days.  Dispense: 14 tablet; Refill: 0 - neomycin-polymyxin-hydrocortisone (CORTISPORIN) OTIC solution; Place 3 drops into the left ear 4 (four) times daily.  Dispense: 10 mL; Refill: 0  2. Gastroesophageal reflux disease, unspecified whether esophagitis present: Uncontrolled. Will discontinue Zantac,  start Protonix 40 mg daily. Follow up in a few weeks, if symptoms continuing will send to GI for EGD.   - pantoprazole (PROTONIX) 40 MG tablet; Take 1 tablet (40 mg total) by mouth daily.  Dispense: 30 tablet; Refill: 3  3. Family history of prostate cancer: PSA with above labs.  -  PSA  4. Lipid screening: Patient is fasting today, check cholesterol.   - Lipid Profile  5. Need for Tdap vaccination: Tdap vaccine given today.   - Tdap vaccine greater than or equal to 46yo IM  No follow-ups on file.   Teodora Medici, DO

## 2022-09-12 ENCOUNTER — Ambulatory Visit: Payer: Managed Care, Other (non HMO) | Admitting: Internal Medicine

## 2022-11-11 NOTE — Progress Notes (Unsigned)
Established Patient Office Visit  Subjective    Patient ID: Max Moore Tabet Jr., male    DOB: 06-07-1976  Age: 47 y.o. MRN: [id number]  CC:  No chief complaint on file.   HPI Max Moore Cuddeback Jr. presents to follow up on chronic medical conditions.  GERD: -Complaining of a palpable lump over his sternum  -Endorses acid reflux, difficulty swallowing with occasional nausea and vomiting.  -Currently on Zantac 75 mg daily. Over the counter Prilosec and Pepcid gave muscle aches - switched to Pantoprazole 40 mg at LOV -Having symptoms for years. Works second shift, comes home around midnight, eats dinner but has trouble when Max [name] lays down to sleep around 1 am, tries to sleep elevated -Does have difficulty swallowing solids, feels like they get stuck in his throat. Particularly meats and breads. No issues with liquids.  -No abdominal pain, appetite decreased, weight stable   Hypertriglyceridemia: -Not currently on medical therapy -Last lipid panel: Lipid Panel     Component Value Date/Time   CHOL 166 02/22/2022 0957   CHOL 172 03/21/2021 1245   TRIG 231 (H) 02/22/2022 0957   HDL 46 02/22/2022 0957   HDL 50 03/21/2021 1245   CHOLHDL 3.6 02/22/2022 0957   LDLCALC 88 02/22/2022 0957   LABVLDL 25 03/21/2021 1245    Health Maintenance: -Blood work up to date -Colonoscopy 01/03/2017, repeat in 10 years    Outpatient Encounter Medications as of 11/12/2022  Medication Sig   neomycin-polymyxin-hydrocortisone (CORTISPORIN) OTIC solution Place 3 drops into the left ear 4 (four) times daily.   pantoprazole (PROTONIX) 40 MG tablet TAKE 1 TABLET BY MOUTH DAILY   No facility-administered encounter medications on file as of 11/12/2022.    No past medical history on file.  Past Surgical History:  Procedure Laterality Date   COLONOSCOPY  2018   HAND TENDON SURGERY Right     Family History  Problem Relation Age of Onset   Breast cancer Mother    Leukemia Father    Diabetes  Sister    Colon cancer Paternal Grandfather    Diabetes Brother        from gun  shot   Heart disease Maternal Grandmother    Heart attack Maternal Grandmother    Colon cancer Paternal Aunt    Colon cancer Paternal Uncle        x 4   Prostate cancer Paternal Uncle        x 2   Lupus Maternal Uncle        x 2   Kidney failure Maternal Uncle     Social History   Socioeconomic History   Marital status: Single    Spouse name: Not on file   Number of children: 2   Years of education: Not on file   Highest education level: Not on file  Occupational History   Not on file  Tobacco Use   Smoking status: Every Day    Packs/day: 1.00    Years: 20.00    Total pack years: 20.00    Types: Cigars, Cigarettes   Smokeless tobacco: Never  Vaping Use   Vaping Use: Never used  Substance and Sexual Activity   Alcohol use: Yes    Alcohol/week: 60.0 standard drinks of alcohol    Types: 60 Standard drinks or equivalent per week    Comment: 2-3 daily   Drug use: Yes    Types: Marijuana   Sexual activity: Yes    Partners: Male  Other Topics  Concern   Not on file  Social History Narrative   Works as a Educational psychologist    Not married    Two children - do not live with this    Social Determinants of Radio broadcast assistant Strain: Not on file  Food Insecurity: Not on file  Transportation Needs: Not on file  Physical Activity: Not on file  Stress: Not on file  Social Connections: Not on file  Intimate Partner Violence: Not on file    Review of Systems  Constitutional:  Negative for chills, fever and weight loss.  HENT:  Positive for ear pain. Negative for congestion, ear discharge, hearing loss, sinus pain, sore throat and tinnitus.   Respiratory:  Negative for cough.   Cardiovascular:  Negative for chest pain.  Gastrointestinal:  Positive for heartburn, nausea and vomiting. Negative for abdominal pain, constipation and diarrhea.  Neurological:  Negative for dizziness and  headaches.        Objective    There were no vitals taken for this visit.  Physical Exam Constitutional:      Appearance: Normal appearance.  HENT:     Head: Normocephalic and atraumatic.     Right Ear: Tympanic membrane and external ear normal.     Left Ear: External ear normal.     Ears:     Comments: Right ear canal erythematous, left ear canal very swollen, hard to see TM but appears to be bulging. Surrounding soft tissues mildly swollen, painful to touch. No pain over mastoid.     Nose: Nose normal.     Mouth/Throat:     Mouth: Mucous membranes are moist.     Comments: Mild PND Eyes:     Conjunctiva/sclera: Conjunctivae normal.  Cardiovascular:     Rate and Rhythm: Normal rate and regular rhythm.  Pulmonary:     Effort: Pulmonary effort is normal.     Breath sounds: Normal breath sounds.  Abdominal:     General: Bowel sounds are normal. There is no distension.     Palpations: Abdomen is soft.     Tenderness: There is no abdominal tenderness. There is no guarding or rebound.  Musculoskeletal:     Cervical back: No tenderness.     Right lower leg: No edema.     Left lower leg: No edema.  Lymphadenopathy:     Cervical: No cervical adenopathy.  Skin:    General: Skin is warm and dry.  Neurological:     General: No focal deficit present.     Mental Status: Max [name] is alert. Mental status is at baseline.  Psychiatric:        Mood and Affect: Mood normal.        Behavior: Behavior normal.         Assessment & Plan:   1. Ear pain, left/Acute otitis externa of left ear, unspecified type: Left ear canal and surrounding tissues swollen and painful. TM difficult to visualize about appears to be bulging. Will treat with oral Amoxicillin BID for 1 week as well as steroid drops for otitis externa. CBC, CMP ordered as well. Follow up in 2 weeks for recheck.  - CBC w/Diff/Platelet - COMPLETE METABOLIC PANEL WITH GFR - amoxicillin (AMOXIL) 875 MG tablet; Take 1 tablet (875  mg total) by mouth 2 (two) times daily for 7 days.  Dispense: 14 tablet; Refill: 0 - neomycin-polymyxin-hydrocortisone (CORTISPORIN) OTIC solution; Place 3 drops into the left ear 4 (four) times daily.  Dispense: 10 mL; Refill: 0  2. Gastroesophageal reflux disease, unspecified whether esophagitis present: Uncontrolled. Will discontinue Zantac, start Protonix 40 mg daily. Follow up in a few weeks, if symptoms continuing will send to GI for EGD.   - pantoprazole (PROTONIX) 40 MG tablet; Take 1 tablet (40 mg total) by mouth daily.  Dispense: 30 tablet; Refill: 3  3. Family history of prostate cancer: PSA with above labs.  - PSA  4. Lipid screening: Patient is fasting today, check cholesterol.   - Lipid Profile  5. Need for Tdap vaccination: Tdap vaccine given today.   - Tdap vaccine greater than or equal to 7yo IM  No follow-ups on file.   Teodora Medici, DO

## 2022-11-12 ENCOUNTER — Encounter: Payer: Self-pay | Admitting: Internal Medicine

## 2022-11-12 ENCOUNTER — Ambulatory Visit (INDEPENDENT_AMBULATORY_CARE_PROVIDER_SITE_OTHER): Payer: Managed Care, Other (non HMO) | Admitting: Internal Medicine

## 2022-11-12 VITALS — BP 140/84 | HR 92 | Temp 97.8°F | Resp 18 | Ht 72.0 in | Wt 206.0 lb

## 2022-11-12 DIAGNOSIS — R200 Anesthesia of skin: Secondary | ICD-10-CM

## 2022-11-12 DIAGNOSIS — R202 Paresthesia of skin: Secondary | ICD-10-CM | POA: Diagnosis not present

## 2022-11-12 DIAGNOSIS — K219 Gastro-esophageal reflux disease without esophagitis: Secondary | ICD-10-CM

## 2022-11-12 DIAGNOSIS — E781 Pure hyperglyceridemia: Secondary | ICD-10-CM

## 2022-11-12 MED ORDER — PANTOPRAZOLE SODIUM 20 MG PO TBEC: 20.0000 mg | DELAYED_RELEASE_TABLET | Freq: Every day | ORAL | 1 refills | Status: AC

## 2022-11-12 NOTE — Patient Instructions (Signed)
It was great seeing you today!  Plan discussed at today's visit: -EMG ordered -Protonix 20 mg sent to pharmacy to take daily for acid reflux -Plan for fasting labs in 6 months   Follow up in: 6 months   Take care and let us know if you have any questions or concerns prior to your next visit.  Dr. Rosana Berger

## 2023-05-09 NOTE — Progress Notes (Deleted)
Established Patient Office Visit  Subjective    Patient ID: Max Moore Ramseur Jr., male    DOB: 1976/07/19  Age: 47 y.o. MRN: [id number]  CC:  No chief complaint on file.   HPI Max Moore Vanduzer Jr. presents to follow up on chronic medical conditions. Patient is having progressively worsening numbness on a small surface area over the anterior aspect of his left thigh. He's had this for years but is progressively worsening. He denies trauma to the area or overlying skin changes. He denies numbness anywhere else.   GERD: -Had been having acid reflux symptoms with difficulty swallowing with occasional nausea and vomiting, along with a palpable lump over his sternum -He was started on Protonix, dose decreased to 20 mg at LOV.  -No abdominal pain, appetite decreased, weight stable   Hypertriglyceridemia: -Not currently on medical therapy -Last lipid panel: Lipid Panel     Component Value Date/Time   CHOL 166 02/22/2022 0957   CHOL 172 03/21/2021 1245   TRIG 231 (H) 02/22/2022 0957   HDL 46 02/22/2022 0957   HDL 50 03/21/2021 1245   CHOLHDL 3.6 02/22/2022 0957   LDLCALC 88 02/22/2022 0957   LABVLDL 25 03/21/2021 1245    Health Maintenance: -Blood work up to date -Colonoscopy 01/03/2017, repeat in 10 years    Outpatient Encounter Medications as of 05/13/2023  Medication Sig   neomycin-polymyxin-hydrocortisone (CORTISPORIN) OTIC solution Place 3 drops into the left ear 4 (four) times daily.   pantoprazole (PROTONIX) 20 MG tablet Take 1 tablet (20 mg total) by mouth daily.   No facility-administered encounter medications on file as of 05/13/2023.    No past medical history on file.  Past Surgical History:  Procedure Laterality Date   COLONOSCOPY  2018   HAND TENDON SURGERY Right     Family History  Problem Relation Age of Onset   Breast cancer Mother    Leukemia Father    Diabetes Sister    Colon cancer Paternal Grandfather    Diabetes Brother        from gun   shot   Heart disease Maternal Grandmother    Heart attack Maternal Grandmother    Colon cancer Paternal Aunt    Colon cancer Paternal Uncle        x 4   Prostate cancer Paternal Uncle        x 2   Lupus Maternal Uncle        x 2   Kidney failure Maternal Uncle     Social History   Socioeconomic History   Marital status: Single    Spouse name: Not on file   Number of children: 2   Years of education: Not on file   Highest education level: Not on file  Occupational History   Not on file  Tobacco Use   Smoking status: Every Day    Current packs/day: 1.00    Average packs/day: 1 pack/day for 20.0 years (20.0 ttl pk-yrs)    Types: Cigars, Cigarettes   Smokeless tobacco: Never  Vaping Use   Vaping status: Never Used  Substance and Sexual Activity   Alcohol use: Yes    Alcohol/week: 60.0 standard drinks of alcohol    Types: 60 Standard drinks or equivalent per week    Comment: 2-3 daily   Drug use: Yes    Types: Marijuana   Sexual activity: Yes    Partners: Male  Other Topics Concern   Not on file  Social History Narrative  Works as a Furniture conservator/restorer    Not married    Two children - do not live with this    Social Determinants of Corporate investment banker Strain: Not on file  Food Insecurity: Not on file  Transportation Needs: Not on file  Physical Activity: Not on file  Stress: Not on file  Social Connections: Not on file  Intimate Partner Violence: Not on file    Review of Systems  Constitutional:  Negative for chills and fever.  Respiratory:  Negative for cough.   Cardiovascular:  Negative for chest pain.  Gastrointestinal:  Negative for abdominal pain, heartburn, nausea and vomiting.  Neurological:  Positive for tingling. Negative for dizziness, weakness and headaches.        Objective    There were no vitals taken for this visit.  Physical Exam Constitutional:      Appearance: Normal appearance.  HENT:     Head: Normocephalic and  atraumatic.  Eyes:     Conjunctiva/sclera: Conjunctivae normal.  Cardiovascular:     Rate and Rhythm: Normal rate and regular rhythm.  Pulmonary:     Effort: Pulmonary effort is normal.     Breath sounds: Normal breath sounds.  Neurological:     General: No focal deficit present.     Mental Status: He is alert. Mental status is at baseline.     Motor: No weakness.     Comments: Strength 5/5 in BLE, decreased sensation within the disruption of the left lateral femoral cutaneous nerve to monofilament   Psychiatric:        Mood and Affect: Mood normal.        Behavior: Behavior normal.         Assessment & Plan:   1. Gastroesophageal reflux disease, unspecified whether esophagitis present: Symptoms improved with Protonix, decrease dose to 20 mg daily.   - pantoprazole (PROTONIX) 20 MG tablet; Take 1 tablet (20 mg total) by mouth daily.  Dispense: 90 tablet; Refill: 1  2. Numbness and tingling of left leg: Odd distrubution, referral to Orthopedics for EMG.  - Ambulatory referral to Orthopedics  3. Hypertriglyceridemia: Reviewed cholesterol results with patient, will decrease sugar and carbs and recheck at follow up.    No follow-ups on file.   Margarita Mail, DO

## 2023-05-13 ENCOUNTER — Ambulatory Visit: Payer: Managed Care, Other (non HMO) | Admitting: Internal Medicine

## 2023-08-03 IMAGING — CR DG HIP (WITH OR WITHOUT PELVIS) 2-3V*R*
3 series · 3 of 3 positions shown · non-contrast
Comparison: None.

CLINICAL DATA: Right hip pain

EXAM:
DG HIP (WITH OR WITHOUT PELVIS) 2-3V RIGHT

[pelvis ap]
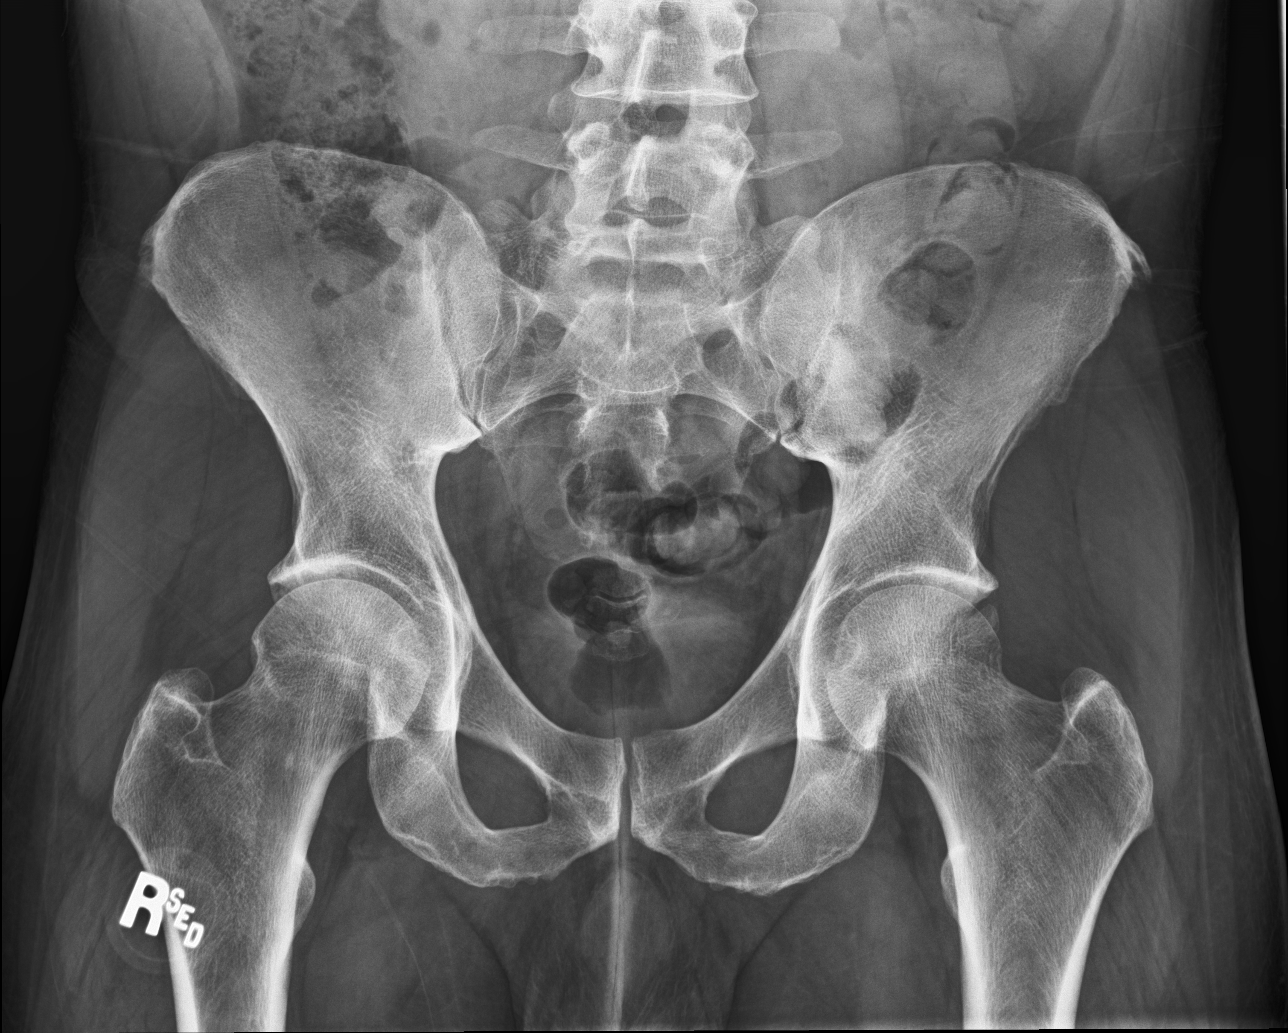

[hip ap]
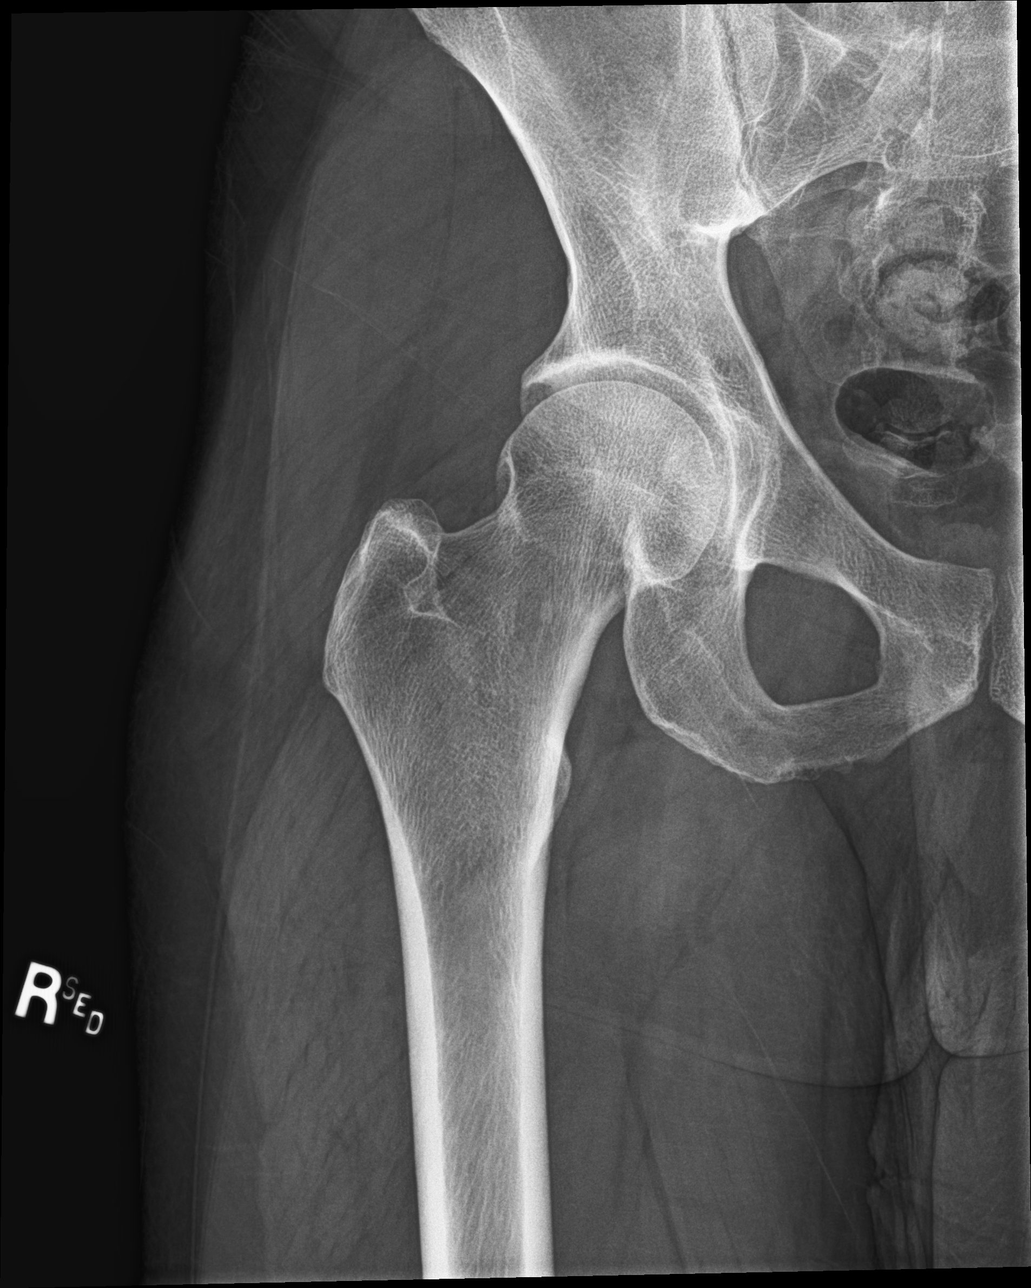

[hip lat]
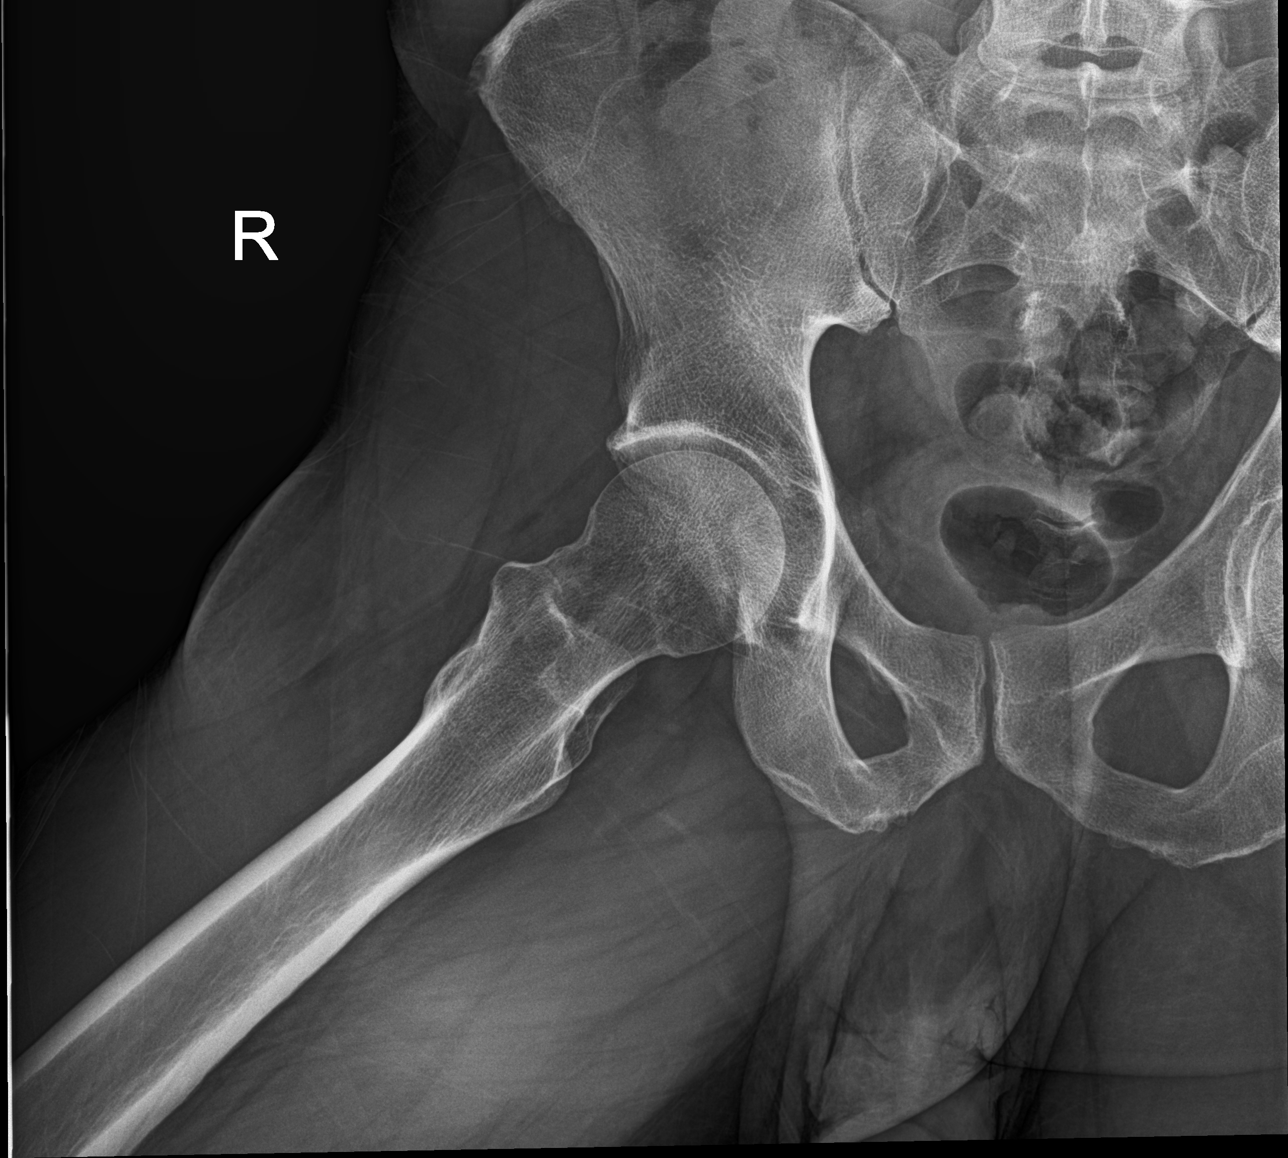

[3 of 3 positions shown; findings below may reference images not displayed]

FINDINGS: There is no evidence of hip fracture or dislocation. There is no
evidence of arthropathy or other focal bone abnormality.
IMPRESSION: Negative.

## 2023-10-23 ENCOUNTER — Other Ambulatory Visit: Payer: Self-pay

## 2023-10-23 ENCOUNTER — Ambulatory Visit (INDEPENDENT_AMBULATORY_CARE_PROVIDER_SITE_OTHER): Payer: Managed Care, Other (non HMO)

## 2023-10-23 ENCOUNTER — Encounter: Payer: Self-pay | Admitting: Emergency Medicine

## 2023-10-23 ENCOUNTER — Ambulatory Visit
Admission: EM | Admit: 2023-10-23 | Discharge: 2023-10-23 | Disposition: A | Discharge status: Home / Self Care | Payer: Managed Care, Other (non HMO) | Attending: Nurse Practitioner | Admitting: Nurse Practitioner

## 2023-10-23 DIAGNOSIS — M25562 Pain in left knee: Secondary | ICD-10-CM

## 2023-10-23 HISTORY — DX: Gastro-esophageal reflux disease without esophagitis: K21.9

## 2023-10-23 NOTE — Discharge Instructions (Signed)
The x-ray of your left knee is negative today for broken or fractured bones in your knee.  Recommend continuing to wear the knee brace whenever you are bearing weight.  Continue Tylenol 500 to 1000 mg every 6 hours as needed for pain.  Also recommend rest and applying ice 15 minutes on, 45 minutes off every hour while awake to help with pain.  Seek care if symptoms do not improve with treatment over the next couple of days.

## 2023-10-23 NOTE — ED Provider Notes (Signed)
[facility]    CSN: [id number] Arrival date & time: 10/23/23  1710      History   Chief Complaint Chief Complaint  Patient presents with   Knee Pain    HPI Max Moore Ferencz Jr. is a 48 y.o. male.   Patient presents today with 2-day history of left knee pain.  Reports while at work after hours, he got his left knee stuck in a crane platform and had to pull on his upper body to pull his knee out.  Reports that there are a couple of scratches to his knee and he has had left knee pain ever since.  He bought a knee brace that seems to help with the pain a little bit, however pain gets very bad when he works.  He denies locking or popping with weightbearing, no sensation of giving way when he walks.  No swelling or bruising per his report.  No numbness or tingling in the toes.    Past Medical History:  Diagnosis Date   Acid reflux     Patient Active Problem List   Diagnosis Date Noted   Gastroesophageal reflux disease 02/22/2022   Tobacco use disorder 09/01/2021   Left elbow tendonitis 09/01/2021   Acute right hip pain 09/01/2021   Essential hypertension 11/23/2016    Past Surgical History:  Procedure Laterality Date   COLONOSCOPY  2018   HAND TENDON SURGERY Right        Home Medications    Prior to Admission medications   Medication Sig Start Date End Date Taking? Authorizing Provider  pantoprazole (PROTONIX) 20 MG tablet Take 1 tablet (20 mg total) by mouth daily. 11/12/22   Margarita Mail, DO    Family History Family History  Problem Relation Age of Onset   Breast cancer Mother    Leukemia Father    Diabetes Sister    Colon cancer Paternal Grandfather    Diabetes Brother        from gun  shot   Heart disease Maternal Grandmother    Heart attack Maternal Grandmother    Colon cancer Paternal Aunt    Colon cancer Paternal Uncle        x 4   Prostate cancer Paternal Uncle        x 2   Lupus Maternal Uncle        x 2   Kidney failure  Maternal Uncle     Social History Social History   Tobacco Use   Smoking status: Every Day    Current packs/day: 1.00    Average packs/day: 1 pack/day for 20.0 years (20.0 ttl pk-yrs)    Types: Cigars, Cigarettes   Smokeless tobacco: Never  Vaping Use   Vaping status: Never Used  Substance Use Topics   Alcohol use: Yes    Alcohol/week: 60.0 standard drinks of alcohol    Types: 60 Standard drinks or equivalent per week    Comment: 2-3 daily   Drug use: Yes    Types: Marijuana     Allergies   Anesthetics, halogenated   Review of Systems Review of Systems Per HPI  Physical Exam Triage Vital Signs ED Triage Vitals  Encounter Vitals Group     BP 10/23/23 1816 (!) 153/98     Systolic BP Percentile --      Diastolic BP Percentile --      Pulse Rate 10/23/23 1816 72     Resp 10/23/23 1816 20     Temp 10/23/23 1816  98.6 F (37 C)     Temp Source 10/23/23 1816 Oral     SpO2 10/23/23 1816 97 %     Weight --      Height --      Head Circumference --      Peak Flow --      Pain Score 10/23/23 1817 6     Pain Loc --      Pain Education --      Exclude from Growth Chart --    No data found.  Updated Vital Signs BP (!) 153/98 (BP Location: Right Arm)   Pulse 72   Temp 98.6 F (37 C) (Oral)   Resp 20   SpO2 97%   Visual Acuity Right Eye Distance:   Left Eye Distance:   Bilateral Distance:    Right Eye Near:   Left Eye Near:    Bilateral Near:     Physical Exam Vitals and nursing note reviewed.  Constitutional:      General: He is not in acute distress.    Appearance: Normal appearance. He is not toxic-appearing.  Pulmonary:     Effort: Pulmonary effort is normal. No respiratory distress.  Musculoskeletal:     Left lower leg: No edema.     Comments: Inspection: 2 small abrasions noted to left knee; no swelling, obvious deformity or redness Palpation: tender to palpation left lateral joint line and left medial joint line of the left knee; no obvious  deformities palpated ROM: Full ROM to left knee; no laxity appreciated Strength: 5/5 left lower extremity Neurovascular: neurovascularly intact in distal left lower extremity  Skin:    General: Skin is warm and dry.     Capillary Refill: Capillary refill takes less than 2 seconds.     Coloration: Skin is not jaundiced or pale.     Findings: No erythema.  Neurological:     Mental Status: He is alert and oriented to person, place, and time.  Psychiatric:        Behavior: Behavior is cooperative.      UC Treatments / Results  Labs (all labs ordered are listed, but only abnormal results are displayed) Labs Reviewed - No data to display  EKG   Radiology DG Knee Complete 4 Views Left Result Date: 10/23/2023 CLINICAL DATA:  Left knee pain since an injury 2 days ago. EXAM: LEFT KNEE - COMPLETE 4+ VIEW COMPARISON:  None Available. FINDINGS: No evidence of fracture, dislocation, or joint effusion. No evidence of arthropathy or other focal bone abnormality. Soft tissues are unremarkable. IMPRESSION: Negative. Electronically Signed   By: Beckie Salts M.D.   On: 10/23/2023 18:42    Procedures Procedures (including critical care time)  Medications Ordered in UC Medications - No data to display  Initial Impression / Assessment and Plan / UC Course  I have reviewed the triage vital signs and the nursing notes.  Pertinent labs & imaging results that were available during my care of the patient were reviewed by me and considered in my medical decision making (see chart for details).   Patient is well-appearing, normotensive, afebrile, not tachycardic, not tachypneic, oxygenating well on room air.    1. Acute pain of left knee X-ray imaging is negative today for acute bony abnormality Discussed results with patient Recommended continuing to use knee brace, rest, elevate, apply ice, take Tylenol as needed for pain Follow-up with orthopedic provider if symptoms do not improve with  treatment  The patient was given the opportunity  to ask questions.  All questions answered to their satisfaction.  The patient is in agreement to this plan.    Final Clinical Impressions(s) / UC Diagnoses   Final diagnoses:  Acute pain of left knee     Discharge Instructions      The x-ray of your left knee is negative today for broken or fractured bones in your knee.  Recommend continuing to wear the knee brace whenever you are bearing weight.  Continue Tylenol 500 to 1000 mg every 6 hours as needed for pain.  Also recommend rest and applying ice 15 minutes on, 45 minutes off every hour while awake to help with pain.  Seek care if symptoms do not improve with treatment over the next couple of days.    ED Prescriptions   None    PDMP not reviewed this encounter.   Valentino Nose, NP 10/23/23 1905

## 2023-10-23 NOTE — ED Triage Notes (Signed)
Pt reports left knee pain after getting left knee caught in crane platform after hours at work on [employer]. Denies workman's comp. Pt reports bought otc brace and reports helped with swelling but reports pain remains.

## 2023-11-06 NOTE — Progress Notes (Signed)
 Name: Max Moore Kalmar Jr.   MRN: [id number]    DOB: [birth year]   Date:11/08/2023       Progress Note  Subjective  Chief Complaint  Chief Complaint  Patient presents with   Annual Exam    HPI  Patient presents for annual CPE.   Diet: Regular, well balanced  Exercise: daily - very active at work  Last Dental Exam: scheduled for later this month  Last Eye Exam: January 2025  Depression: phq 9 is negative    11/08/2023    2:39 PM 11/12/2022    3:19 PM 03/13/2022    9:18 AM 02/22/2022    9:16 AM 09/01/2021    3:41 PM  Depression screen PHQ 2/9  Decreased Interest 0 0 0 0 0  Down, Depressed, Hopeless 0 0 0 0 2  PHQ - 2 Score 0 0 0 0 2  Altered sleeping  0  0 0  Tired, decreased energy  0  0 0  Change in appetite  0  0 2  Feeling bad or failure about yourself   0  0 3  Trouble concentrating  0  0 0  Moving slowly or fidgety/restless  0  0 0  Suicidal thoughts  0  0 1  PHQ-9 Score  0  0 8  Difficult doing work/chores  Not difficult at all  Not difficult at all Not difficult at all    Hypertension:  BP Readings from Last 3 Encounters:  11/08/23 128/78  10/23/23 (!) 153/98  11/12/22 (!) 140/84   Obesity: Wt Readings from Last 3 Encounters:  11/08/23 190 lb 6.4 oz (86.4 kg)  11/12/22 206 lb (93.4 kg)  03/13/22 200 lb 9.6 oz (91 kg)   BMI Readings from Last 3 Encounters:  11/08/23 25.82 kg/m  11/12/22 27.94 kg/m  03/13/22 27.21 kg/m     [employer] Visit from 11/08/2023 in [facility]  AUDIT-C Score 0      Married  STD testing and prevention (HIV/chl/gon/syphilis):  no concerns Hep C Screening: completed Skin cancer: Discussed monitoring for atypical lesions Colorectal cancer: colonoscopy 01/03/2017, can repeat in 10 years Prostate cancer:  02/22/2022 last lab - family history of prostate cancer in multiple relatives on his father's side Lab Results  Component Value Date   PSA 0.22 02/22/2022   Lung cancer:  Low Dose CT  Chest recommended if Age 33-80 years, 30 pack-year currently smoking OR have quit w/in 15years. Patient  is not a candidate for screening   AAA: The USPSTF recommends one-time screening with ultrasonography in men ages 16 to 75 years who have ever smoked. Patient   is not a candidate for screening   Vaccines reviewed with the patient. Declines flu vaccine.   Advanced Care Planning: A voluntary discussion about advance care planning including the explanation and discussion of advance directives.  Discussed health care proxy and Living will, and the patient was able to identify a health care proxy as Max [name] (wife).  Patient does not have a living will and power of attorney of health care   Patient Active Problem List   Diagnosis Date Noted   Gastroesophageal reflux disease 02/22/2022   Tobacco use disorder 09/01/2021   Left elbow tendonitis 09/01/2021   Acute right hip pain 09/01/2021   Essential hypertension 11/23/2016    Past Surgical History:  Procedure Laterality Date   COLONOSCOPY  2018   HAND TENDON SURGERY Right     Family History  Problem  Relation Age of Onset   Breast cancer Mother    Leukemia Father    Diabetes Sister    Colon cancer Paternal Grandfather    Diabetes Brother        from gun  shot   Heart disease Maternal Grandmother    Heart attack Maternal Grandmother    Colon cancer Paternal Aunt    Colon cancer Paternal Uncle        x 4   Prostate cancer Paternal Uncle        x 2   Lupus Maternal Uncle        x 2   Kidney failure Maternal Uncle     Social History   Socioeconomic History   Marital status: Married    Spouse name: Max [name]   Number of children: 2   Years of education: Not on file   Highest education level: Not on file  Occupational History   Not on file  Tobacco Use   Smoking status: Every Day    Current packs/day: 1.00    Average packs/day: 1 pack/day for 20.0 years (20.0 ttl pk-yrs)    Types: Cigars, Cigarettes   Smokeless tobacco:  Never  Vaping Use   Vaping status: Never Used  Substance and Sexual Activity   Alcohol use: Yes    Alcohol/week: 60.0 standard drinks of alcohol    Types: 60 Standard drinks or equivalent per week    Comment: 2-3 daily   Drug use: Yes    Types: Marijuana   Sexual activity: Yes    Partners: Male  Other Topics Concern   Not on file  Social History Narrative   Works as a furniture conservator/restorer    married    Two children - do not live with this    Social Drivers of Corporate Investment Banker Strain: Low Risk  (11/08/2023)   Overall Financial Resource Strain (CARDIA)    Difficulty of Paying Living Expenses: Not hard at all  Food Insecurity: No Food Insecurity (11/08/2023)   Hunger Vital Sign    Worried About Running Out of Food in the Last Year: Never true    Ran Out of Food in the Last Year: Never true  Transportation Needs: No Transportation Needs (11/08/2023)   PRAPARE - Administrator, Civil Service (Medical): No    Lack of Transportation (Non-Medical): No  Physical Activity: Sufficiently Active (11/08/2023)   Exercise Vital Sign    Days of Exercise per Week: 5 days    Minutes of Exercise per Session: 150+ min  Stress: No Stress Concern Present (11/08/2023)   [employer] of Occupational Health - Occupational Stress Questionnaire    Feeling of Stress : Not at all  Social Connections: Moderately Isolated (11/08/2023)   Social Connection and Isolation Panel [NHANES]    Frequency of Communication with Friends and Family: More than three times a week    Frequency of Social Gatherings with Friends and Family: More than three times a week    Attends Religious Services: 1 to 4 times per year    Active Member of [employer] or Organizations: No    Attends Banker Meetings: Never    Marital Status: Separated  Intimate Partner Violence: Not At Risk (11/08/2023)   Humiliation, Afraid, Rape, and Kick questionnaire    Fear of Current or Ex-Partner: No    Emotionally Abused:  No    Physically Abused: No    Sexually Abused: No     Current Outpatient  Medications:    pantoprazole  (PROTONIX ) 20 MG tablet, Take 1 tablet (20 mg total) by mouth daily., Disp: 90 tablet, Rfl: 1  Allergies  Allergen Reactions   Anesthetics, Halogenated Nausea And Vomiting and Cough     Review of Systems  All other systems reviewed and are negative.    Objective  Vitals:   11/08/23 1440 11/08/23 1457  BP:  128/78  Pulse: 100   Resp: 16   Temp: 97.9 F (36.6 C)   TempSrc: Oral   Weight: 190 lb 6.4 oz (86.4 kg)   Height: 6' (1.829 m)     Body mass index is 25.82 kg/m.  Physical Exam Constitutional:      Appearance: Normal appearance.  HENT:     Head: Normocephalic and atraumatic.     Mouth/Throat:     Mouth: Mucous membranes are moist.     Pharynx: Oropharynx is clear.  Eyes:     Extraocular Movements: Extraocular movements intact.     Conjunctiva/sclera: Conjunctivae normal.     Pupils: Pupils are equal, round, and reactive to light.  Neck:     Comments: No thyromegaly Cardiovascular:     Rate and Rhythm: Normal rate and regular rhythm.  Pulmonary:     Effort: Pulmonary effort is normal.     Breath sounds: Normal breath sounds.  Musculoskeletal:     Cervical back: No tenderness.     Right lower leg: No edema.     Left lower leg: No edema.  Lymphadenopathy:     Cervical: No cervical adenopathy.  Skin:    General: Skin is warm and dry.  Neurological:     General: No focal deficit present.     Mental Status: He is alert. Mental status is at baseline.  Psychiatric:        Mood and Affect: Mood normal.        Behavior: Behavior normal.     Last CBC Lab Results  Component Value Date   WBC 5.4 02/22/2022   HGB 14.6 02/22/2022   HCT 43.0 02/22/2022   MCV 85.5 02/22/2022   MCH 29.0 02/22/2022   RDW 14.2 02/22/2022   PLT 186 02/22/2022   Last metabolic panel Lab Results  Component Value Date   GLUCOSE 86 02/22/2022   NA 143 02/22/2022   K  4.6 02/22/2022   CL 109 02/22/2022   CO2 27 02/22/2022   BUN 15 02/22/2022   CREATININE 1.20 02/22/2022   EGFR 76 02/22/2022   CALCIUM 9.4 02/22/2022   PROT 6.9 02/22/2022   ALBUMIN 4.6 03/21/2021   LABGLOB 2.4 03/21/2021   AGRATIO 1.9 03/21/2021   BILITOT 0.7 02/22/2022   ALKPHOS 67 03/21/2021   AST 21 02/22/2022   ALT 23 02/22/2022   Last lipids Lab Results  Component Value Date   CHOL 166 02/22/2022   HDL 46 02/22/2022   LDLCALC 88 02/22/2022   TRIG 231 (H) 02/22/2022   CHOLHDL 3.6 02/22/2022   Last hemoglobin A1c No results found for: HGBA1C Last thyroid functions Lab Results  Component Value Date   TSH 3.190 03/21/2021   Last vitamin D No results found for: 25OHVITD2, 25OHVITD3, VD25OH Last vitamin B12 and Folate No results found for: VITAMINB12, FOLATE    Assessment & Plan  1. Annual physical exam (Primary)/Lipid screening/Prostate cancer screening/Family history of prostate cancer: Physical exam completed, health maintenance reviewed and annual labs ordered.   - CBC w/Diff/Platelet - COMPLETE METABOLIC PANEL WITH GFR - Lipid Profile - PSA   -  Prostate cancer screening and PSA options (with potential risks and benefits of testing vs not testing) were discussed along with recent recs/guidelines. -USPSTF grade A and B recommendations reviewed with patient; age-appropriate recommendations, preventive care, screening tests, etc discussed and encouraged; healthy living encouraged; see AVS for patient education given to patient -Discussed importance of 150 minutes of physical activity weekly, eat two servings of fish weekly, eat one serving of tree nuts ( cashews, pistachios, pecans, almonds.SABRA) every other day, eat 6 servings of fruit/vegetables daily and drink plenty of water and avoid sweet beverages.  -Reviewed Health Maintenance: yes

## 2023-11-08 ENCOUNTER — Encounter: Payer: Self-pay | Admitting: Internal Medicine

## 2023-11-08 ENCOUNTER — Ambulatory Visit: Payer: Managed Care, Other (non HMO) | Admitting: Internal Medicine

## 2023-11-08 ENCOUNTER — Other Ambulatory Visit: Payer: Self-pay

## 2023-11-08 VITALS — BP 128/78 | HR 100 | Temp 97.9°F | Resp 16 | Ht 72.0 in | Wt 190.4 lb

## 2023-11-08 DIAGNOSIS — Z8042 Family history of malignant neoplasm of prostate: Secondary | ICD-10-CM | POA: Diagnosis not present

## 2023-11-08 DIAGNOSIS — Z13220 Encounter for screening for lipoid disorders: Secondary | ICD-10-CM

## 2023-11-08 DIAGNOSIS — Z0000 Encounter for general adult medical examination without abnormal findings: Secondary | ICD-10-CM | POA: Diagnosis not present

## 2023-11-08 DIAGNOSIS — Z125 Encounter for screening for malignant neoplasm of prostate: Secondary | ICD-10-CM | POA: Diagnosis not present

## 2023-11-08 NOTE — Addendum Note (Signed)
 Addended by: Rockney Cid on: 11/08/2023 03:06 PM   Modules accepted: Orders

## 2024-11-10 ENCOUNTER — Encounter: Payer: Managed Care, Other (non HMO) | Admitting: Internal Medicine

## 2024-11-12 ENCOUNTER — Encounter: Admitting: Internal Medicine
